# Patient Record
Sex: Male | Born: 1970 | Race: White | Marital: Single
Health system: Northeastern US, Community
[De-identification: ages and names within clinical notes are randomized; demographics above are authoritative.]

## PROBLEM LIST (undated history)

## (undated) DIAGNOSIS — G44009 Cluster headache syndrome, unspecified, not intractable: Secondary | ICD-10-CM

## (undated) DIAGNOSIS — Z9889 Other specified postprocedural states: Secondary | ICD-10-CM

## (undated) DIAGNOSIS — M1990 Unspecified osteoarthritis, unspecified site: Secondary | ICD-10-CM

## (undated) DIAGNOSIS — R112 Nausea with vomiting, unspecified: Secondary | ICD-10-CM

## (undated) DIAGNOSIS — M5030 Other cervical disc degeneration, unspecified cervical region: Secondary | ICD-10-CM

## (undated) DIAGNOSIS — T8859XA Other complications of anesthesia, initial encounter: Secondary | ICD-10-CM

## (undated) DIAGNOSIS — S42309A Unspecified fracture of shaft of humerus, unspecified arm, initial encounter for closed fracture: Secondary | ICD-10-CM

## (undated) DIAGNOSIS — T4145XA Adverse effect of unspecified anesthetic, initial encounter: Secondary | ICD-10-CM

## (undated) HISTORY — PX: KNEE ARTHROSCOPY: SUR90

## (undated) HISTORY — PX: TONSILLECTOMY: SUR1361

---

## 1998-12-22 ENCOUNTER — Ambulatory Visit: Admission: RE | Admit: 1998-12-22 | Discharge: 1998-12-22 | Payer: Self-pay | Admitting: Family Medicine

## 1999-04-12 ENCOUNTER — Encounter: Payer: Self-pay | Admitting: Emergency Medicine

## 1999-04-12 ENCOUNTER — Emergency Department (HOSPITAL_COMMUNITY): Admission: EM | Admit: 1999-04-12 | Discharge: 1999-04-12 | Payer: Self-pay | Admitting: Emergency Medicine

## 2002-06-18 ENCOUNTER — Ambulatory Visit (HOSPITAL_COMMUNITY): Admission: RE | Admit: 2002-06-18 | Discharge: 2002-06-18 | Payer: Self-pay

## 2014-09-23 ENCOUNTER — Encounter (HOSPITAL_COMMUNITY): Payer: Self-pay | Admitting: Emergency Medicine

## 2014-09-23 ENCOUNTER — Emergency Department (HOSPITAL_COMMUNITY)
Admission: EM | Admit: 2014-09-23 | Discharge: 2014-09-23 | Disposition: A | Discharge status: Home / Self Care | Payer: BC Managed Care – PPO | Attending: Emergency Medicine | Admitting: Emergency Medicine

## 2014-09-23 DIAGNOSIS — Y939 Activity, unspecified: Secondary | ICD-10-CM | POA: Diagnosis not present

## 2014-09-23 DIAGNOSIS — Y929 Unspecified place or not applicable: Secondary | ICD-10-CM | POA: Insufficient documentation

## 2014-09-23 DIAGNOSIS — X58XXXA Exposure to other specified factors, initial encounter: Secondary | ICD-10-CM | POA: Insufficient documentation

## 2014-09-23 DIAGNOSIS — Z791 Long term (current) use of non-steroidal anti-inflammatories (NSAID): Secondary | ICD-10-CM | POA: Insufficient documentation

## 2014-09-23 DIAGNOSIS — S4991XA Unspecified injury of right shoulder and upper arm, initial encounter: Secondary | ICD-10-CM | POA: Insufficient documentation

## 2014-09-23 DIAGNOSIS — S46001A Unspecified injury of muscle(s) and tendon(s) of the rotator cuff of right shoulder, initial encounter: Secondary | ICD-10-CM

## 2014-09-23 DIAGNOSIS — M1990 Unspecified osteoarthritis, unspecified site: Secondary | ICD-10-CM | POA: Insufficient documentation

## 2014-09-23 DIAGNOSIS — Z8669 Personal history of other diseases of the nervous system and sense organs: Secondary | ICD-10-CM | POA: Insufficient documentation

## 2014-09-23 DIAGNOSIS — Z79899 Other long term (current) drug therapy: Secondary | ICD-10-CM | POA: Insufficient documentation

## 2014-09-23 HISTORY — DX: Cluster headache syndrome, unspecified, not intractable: G44.009

## 2014-09-23 HISTORY — DX: Unspecified osteoarthritis, unspecified site: M19.90

## 2014-09-23 MED ORDER — METHOCARBAMOL 500 MG PO TABS: 500.0000 mg | ORAL_TABLET | Freq: Four times a day (QID) | ORAL | Status: DC | PRN

## 2014-09-23 MED ORDER — OXYCODONE-ACETAMINOPHEN 5-325 MG PO TABS
1.0000 | ORAL_TABLET | ORAL | Status: DC | PRN
Start: 2014-09-23 — End: 2014-11-28

## 2014-09-23 MED ORDER — OXYCODONE-ACETAMINOPHEN 5-325 MG PO TABS: 1.0000 | ORAL_TABLET | ORAL | Status: DC | PRN

## 2014-09-23 MED ORDER — NAPROXEN 500 MG PO TABS: 500.0000 mg | ORAL_TABLET | Freq: Two times a day (BID) | ORAL | Status: DC

## 2014-09-23 MED ORDER — OXYCODONE-ACETAMINOPHEN 5-325 MG PO TABS
1.0000 | ORAL_TABLET | Freq: Once | ORAL | Status: AC
  Administered 2014-09-23: 1 via ORAL
  Filled 2014-09-23: qty 1

## 2014-09-23 NOTE — ED Provider Notes (Signed)
CSN: [id number]     Arrival date & time 09/23/14  0304 History   First MD Initiated Contact with Patient 09/23/14 0335     Chief Complaint  Patient presents with  . Extremity Pain     (Consider location/radiation/quality/duration/timing/severity/associated sxs/prior Treatment) Patient is a 43 y.o. male presenting with extremity pain. The history is provided by the patient.  Extremity Pain  He woke up 3 days ago with pain in the posterior aspect of the right shoulder. Pain did radiate into the shoulder and into the upper arm. He describes a burning pain. Pain is as severe as 7/10. It seems to be worse when he sits down and better if he stands up. Her he denies any trauma but states he did have to help lift up a washer and dryer about one week earlier. There is no numbness or tingling. He went to another emergency department where he was diagnosed with muscle spasm and was given a prescription for diazepam. There has been some dyspnea but has not helped the pain. He has been taking naproxen 660 mg 3 times a day without relief.  Past Medical History  Diagnosis Date  . Arthritis   . Headache, cluster    Past Surgical History  Procedure Laterality Date  . Knee arthroscopy    . Tonsillectomy     No family history on file. History  Substance Use Topics  . Smoking status: Never Smoker   . Smokeless tobacco: Not on file  . Alcohol Use: Yes    Review of Systems  All other systems reviewed and are negative.     Allergies  Review of patient's allergies indicates no known allergies.  Home Medications   Prior to Admission medications   Medication Sig Start Date End Date Taking? Authorizing Provider  diazepam (VALIUM) 5 MG tablet Take by mouth 2 (two) times daily as needed for anxiety.   Yes Historical Provider, MD  methocarbamol (ROBAXIN) 500 MG tablet Take 1 tablet (500 mg total) by mouth 4 (four) times daily as needed for muscle spasms. 09/23/14   Dione Boozeavid Eladio Dentremont, MD  naproxen  (NAPROSYN) 500 MG tablet Take 1 tablet (500 mg total) by mouth 2 (two) times daily. 09/23/14   Dione Boozeavid Carly Sabo, MD  oxyCODONE-acetaminophen (PERCOCET) 5-325 MG per tablet Take 1 tablet by mouth every 4 (four) hours as needed for moderate pain. 09/23/14   Dione Boozeavid Rema Lievanos, MD  oxyCODONE-acetaminophen (PERCOCET/ROXICET) 5-325 MG per tablet Take 1 tablet by mouth every 4 (four) hours as needed. 09/23/14   Dione Boozeavid Akansha Wyche, MD   BP 134/92  Pulse 94  Temp(Src) 97.6 F (36.4 C) (Oral)  Resp 18  Ht 5\' 7"  (1.702 m)  Wt 250 lb (113.399 kg)  BMI 39.15 kg/m2  SpO2 99% Physical Exam  Nursing note and vitals reviewed.  43 year old male, resting comfortably and in no acute distress. Vital signs are significant for borderline hypertension. Oxygen saturation is 99%, which is normal. Head is normocephalic and atraumatic. PERRLA, EOMI. Oropharynx is clear. Neck is nontender and supple without adenopathy or JVD. Back has mild tenderness superior and lateral to the right scapula. There is no CVA tenderness. Lungs are clear without rales, wheezes, or rhonchi. Chest is nontender. Heart has regular rate and rhythm without murmur. Abdomen is soft, flat, nontender without masses or hepatosplenomegaly and peristalsis is normoactive. Extremities: There is marked tenderness to palpation of the right shoulder and the anterior deltoid groove. Rotator cuff impingement signs are are present. Passive range of motion is  present. Distal neurovascular exam is an intact grip strong pulses, a prompt capillary refill, normal sensation. Objective weight, he has normal strength in all muscles of the right arm. Skin is warm and dry without rash. Neurologic: Mental status is normal, cranial nerves are intact, there are no motor or sensory deficits.   ED Course  Procedures (including critical care time)  MDM   Final diagnoses:  Injury of right rotator cuff, initial encounter    Right rotator cuff injury. He is referred to orthopedics  for followup. He is given prescriptions for naproxen, oxycodone acetaminophen, and methocarbamol. He is advised to limit his dose of naproxen 2 the prescribed amount-500 mg twice a day.    Dione Boozeavid Thia Olesen, MD 09/23/14 0400

## 2014-09-23 NOTE — ED Notes (Signed)
Pt reports he woke up Saturday & was having right shoulder pain. C/o of a burning feeling. [facility] & given valium w/ no relief. Pt denies any new activity or injury.

## 2014-09-23 NOTE — Discharge Instructions (Signed)
Rotator Cuff Tendinitis  °Rotator cuff tendinitis is inflammation of the tough, cord-like bands that connect muscle to bone (tendons) in your rotator cuff. Your rotator cuff is the collection of all the muscles and tendons that connect your arm to your shoulder. Your rotator cuff holds the head of your upper arm bone (humerus) in the cup (fossa) of your shoulder blade (scapula). °CAUSES °Rotator cuff tendinitis is usually caused by overusing the joint involved.  °SIGNS AND SYMPTOMS °· Deep ache in the shoulder also felt on the outside upper arm over the shoulder muscle. °· Point tenderness over the area that is injured. °· Pain comes on gradually and becomes worse with lifting the arm to the side (abduction) or turning it inward (internal rotation). °· May lead to a chronic tear: When a rotator cuff tendon becomes inflamed, it runs the risk of losing its blood supply, causing some tendon fibers to die. This increases the risk that the tendon can fray and partially or completely tear. °DIAGNOSIS °Rotator cuff tendinitis is diagnosed by taking a medical history, performing a physical exam, and reviewing results of imaging exams. The medical history is useful to help determine the type of rotator cuff injury. The physical exam will include looking at the injured shoulder, feeling the injured area, and watching you do range-of-motion exercises. X-ray exams are typically done to rule out other causes of shoulder pain, such as fractures. MRI is the imaging exam usually used for significant shoulder injuries. Sometimes a dye study called CT arthrogram is done, but it is not as widely used as MRI. In some institutions, special ultrasound tests may also be used to aid in the diagnosis. °TREATMENT  °Less Severe Cases °· Use of a sling to rest the shoulder for a short period of time. Prolonged use of the sling can cause stiffness, weakness, and loss of motion of the shoulder joint. °· Anti-inflammatory medicines, such as  ibuprofen or naproxen sodium, may be prescribed. °More Severe Cases °· Physical therapy. °· Use of steroid injections into the shoulder joint. °· Surgery. °HOME CARE INSTRUCTIONS  °· Use a sling or splint until the pain decreases. Prolonged use of the sling can cause stiffness, weakness, and loss of motion of the shoulder joint. °· Apply ice to the injured area: °¨ Put ice in a plastic bag. °¨ Place a towel between your skin and the bag. °¨ Leave the ice on for 20 minutes, 2-3 times a day. °· Try to avoid use other than gentle range of motion while your shoulder is painful. Use the shoulder and exercise only as directed by your health care provider. Stop exercises or range of motion if pain or discomfort increases, unless directed otherwise by your health care provider. °· Only take over-the-counter or prescription medicines for pain, discomfort, or fever as directed by your health care provider. °· If you were given a shoulder sling and straps (immobilizer), do not remove it except as directed, or until you see a health care provider for a follow-up exam. If you need to remove it, move your arm as little as possible or as directed. °· You may want to sleep on several pillows at night to lessen swelling and pain. °SEEK IMMEDIATE MEDICAL CARE IF:  °· Your shoulder pain increases or new pain develops in your arm, hand, or fingers and is not relieved with medicines. °· You have new, unexplained symptoms, especially increased numbness in the hands or loss of strength. °· You develop any worsening of the problems   that brought you in for care.  Your arm, hand, or fingers are numb or tingling.  Your arm, hand, or fingers are swollen, painful, or turn white or blue. MAKE SURE YOU:  Understand these instructions.  Will watch your condition.  Will get help right away if you are not doing well or get worse. Document Released: 02/11/2004 Document Revised: 09/11/2013 Document Reviewed: 07/03/2013 [facility] Patient  Information 2015 [employer] [place]. This information is not intended to replace advice given to you by your health care provider. Make sure you discuss any questions you have with your health care provider.  Naproxen and naproxen sodium oral immediate-release tablets What is this medicine? NAPROXEN (na PROX en) is a non-steroidal anti-inflammatory drug (NSAID). It is used to reduce swelling and to treat pain. This medicine may be used for dental pain, headache, or painful monthly periods. It is also used for painful joint and muscular problems such as arthritis, tendinitis, bursitis, and gout. This medicine may be used for other purposes; ask your health care provider or pharmacist if you have questions. COMMON BRAND NAME(S): Aflaxen, Aleve, Aleve Arthritis, All Day Relief, Anaprox, Anaprox DS, Naprosyn What should I tell my health care provider before I take this medicine? They need to know if you have any of these conditions: -asthma -cigarette smoker -drink more than 3 alcohol containing drinks a day -heart disease or circulation problems such as heart failure or leg edema (fluid retention) -high blood pressure -kidney disease -liver disease -stomach bleeding or ulcers -an unusual or allergic reaction to naproxen, aspirin, other NSAIDs, other medicines, foods, dyes, or preservatives -pregnant or trying to get pregnant -breast-feeding How should I use this medicine? Take this medicine by mouth with a glass of water. Follow the directions on the prescription label. Take it with food if your stomach gets upset. Try to not lie down for at least 10 minutes after you take it. Take your medicine at regular intervals. Do not take your medicine more often than directed. Long-term, continuous use may increase the risk of heart attack or stroke. A special MedGuide will be given to you by the pharmacist with each prescription and refill. Be sure to read this information carefully each time. Talk to your  pediatrician regarding the use of this medicine in children. Special care may be needed. Overdosage: If you think you have taken too much of this medicine contact a poison control center or emergency room at once. NOTE: This medicine is only for you. Do not share this medicine with others. What if I miss a dose? If you miss a dose, take it as soon as you can. If it is almost time for your next dose, take only that dose. Do not take double or extra doses. What may interact with this medicine? -alcohol -aspirin -cidofovir -diuretics -lithium -methotrexate -other drugs for inflammation like ketorolac or prednisone -pemetrexed -probenecid -warfarin This list may not describe all possible interactions. Give your health care provider a list of all the medicines, herbs, non-prescription drugs, or dietary supplements you use. Also tell them if you smoke, drink alcohol, or use illegal drugs. Some items may interact with your medicine. What should I watch for while using this medicine? Tell your doctor or health care professional if your pain does not get better. Talk to your doctor before taking another medicine for pain. Do not treat yourself. This medicine does not prevent heart attack or stroke. In fact, this medicine may increase the chance of a heart attack or stroke.  The chance may increase with longer use of this medicine and in people who have heart disease. If you take aspirin to prevent heart attack or stroke, talk with your doctor or health care professional. Do not take other medicines that contain aspirin, ibuprofen, or naproxen with this medicine. Side effects such as stomach upset, nausea, or ulcers may be more likely to occur. Many medicines available without a prescription should not be taken with this medicine. This medicine can cause ulcers and bleeding in the stomach and intestines at any time during treatment. Do not smoke cigarettes or drink alcohol. These increase irritation to  your stomach and can make it more susceptible to damage from this medicine. Ulcers and bleeding can happen without warning symptoms and can cause death. You may get drowsy or dizzy. Do not drive, use machinery, or do anything that needs mental alertness until you know how this medicine affects you. Do not stand or sit up quickly, especially if you are an older patient. This reduces the risk of dizzy or fainting spells. This medicine can cause you to bleed more easily. Try to avoid damage to your teeth and gums when you brush or floss your teeth. What side effects may I notice from receiving this medicine? Side effects that you should report to your doctor or health care professional as soon as possible: -black or bloody stools, blood in the urine or vomit -blurred vision -chest pain -difficulty breathing or wheezing -nausea or vomiting -severe stomach pain -skin rash, skin redness, blistering or peeling skin, hives, or itching -slurred speech or weakness on one side of the body -swelling of eyelids, throat, lips -unexplained weight gain or swelling -unusually weak or tired -yellowing of eyes or skin Side effects that usually do not require medical attention (report to your doctor or health care professional if they continue or are bothersome): -constipation -headache -heartburn This list may not describe all possible side effects. Call your doctor for medical advice about side effects. You may report side effects to FDA at 1-800-FDA-1088. Where should I keep my medicine? Keep out of the reach of children. Store at room temperature between 15 and 30 degrees C (59 and 86 degrees F). Keep container tightly closed. Throw away any unused medicine after the expiration date. NOTE: This sheet is a summary. It may not cover all possible information. If you have questions about this medicine, talk to your doctor, pharmacist, or health care provider.  2015, [employer] Standard. (2009-11-23  20:10:16)  Acetaminophen; Oxycodone tablets What is this medicine? ACETAMINOPHEN; OXYCODONE (a set a MEE noe fen; ox i KOE done) is a pain reliever. It is used to treat mild to moderate pain. This medicine may be used for other purposes; ask your health care provider or pharmacist if you have questions. COMMON BRAND NAME(S): Endocet, Magnacet, Narvox, Percocet, Perloxx, Primalev, Primlev, Roxicet, Xolox What should I tell my health care provider before I take this medicine? They need to know if you have any of these conditions: -brain tumor -Crohn's disease, inflammatory bowel disease, or ulcerative colitis -drug abuse or addiction -head injury -heart or circulation problems -if you often drink alcohol -kidney disease or problems going to the bathroom -liver disease -lung disease, asthma, or breathing problems -an unusual or allergic reaction to acetaminophen, oxycodone, other opioid analgesics, other medicines, foods, dyes, or preservatives -pregnant or trying to get pregnant -breast-feeding How should I use this medicine? Take this medicine by mouth with a full glass of water. Follow the directions on  the prescription label. Take your medicine at regular intervals. Do not take your medicine more often than directed. Talk to your pediatrician regarding the use of this medicine in children. Special care may be needed. Patients over 70 years old may have a stronger reaction and need a smaller dose. Overdosage: If you think you have taken too much of this medicine contact a poison control center or emergency room at once. NOTE: This medicine is only for you. Do not share this medicine with others. What if I miss a dose? If you miss a dose, take it as soon as you can. If it is almost time for your next dose, take only that dose. Do not take double or extra doses. What may interact with this medicine? -alcohol -antihistamines -barbiturates like amobarbital, butalbital, butabarbital,  methohexital, pentobarbital, phenobarbital, thiopental, and secobarbital -benztropine -drugs for bladder problems like solifenacin, trospium, oxybutynin, tolterodine, hyoscyamine, and methscopolamine -drugs for breathing problems like ipratropium and tiotropium -drugs for certain stomach or intestine problems like propantheline, homatropine methylbromide, glycopyrrolate, atropine, belladonna, and dicyclomine -general anesthetics like etomidate, ketamine, nitrous oxide, propofol, desflurane, enflurane, halothane, isoflurane, and sevoflurane -medicines for depression, anxiety, or psychotic disturbances -medicines for sleep -muscle relaxants -naltrexone -narcotic medicines (opiates) for pain -phenothiazines like perphenazine, thioridazine, chlorpromazine, mesoridazine, fluphenazine, prochlorperazine, promazine, and trifluoperazine -scopolamine -tramadol -trihexyphenidyl This list may not describe all possible interactions. Give your health care provider a list of all the medicines, herbs, non-prescription drugs, or dietary supplements you use. Also tell them if you smoke, drink alcohol, or use illegal drugs. Some items may interact with your medicine. What should I watch for while using this medicine? Tell your doctor or health care professional if your pain does not go away, if it gets worse, or if you have new or a different type of pain. You may develop tolerance to the medicine. Tolerance means that you will need a higher dose of the medication for pain relief. Tolerance is normal and is expected if you take this medicine for a long time. Do not suddenly stop taking your medicine because you may develop a severe reaction. Your body becomes used to the medicine. This does NOT mean you are addicted. Addiction is a behavior related to getting and using a drug for a non-medical reason. If you have pain, you have a medical reason to take pain medicine. Your doctor will tell you how much medicine to  take. If your doctor wants you to stop the medicine, the dose will be slowly lowered over time to avoid any side effects. You may get drowsy or dizzy. Do not drive, use machinery, or do anything that needs mental alertness until you know how this medicine affects you. Do not stand or sit up quickly, especially if you are an older patient. This reduces the risk of dizzy or fainting spells. Alcohol may interfere with the effect of this medicine. Avoid alcoholic drinks. There are different types of narcotic medicines (opiates) for pain. If you take more than one type at the same time, you may have more side effects. Give your health care provider a list of all medicines you use. Your doctor will tell you how much medicine to take. Do not take more medicine than directed. Call emergency for help if you have problems breathing. The medicine will cause constipation. Try to have a bowel movement at least every 2 to 3 days. If you do not have a bowel movement for 3 days, call your doctor or health care professional. Do  not take Tylenol (acetaminophen) or medicines that have acetaminophen with this medicine. Too much acetaminophen can be very dangerous. Many nonprescription medicines contain acetaminophen. Always read the labels carefully to avoid taking more acetaminophen. What side effects may I notice from receiving this medicine? Side effects that you should report to your doctor or health care professional as soon as possible: -allergic reactions like skin rash, itching or hives, swelling of the face, lips, or tongue -breathing difficulties, wheezing -confusion -light headedness or fainting spells -severe stomach pain -unusually weak or tired -yellowing of the skin or the whites of the eyes Side effects that usually do not require medical attention (report to your doctor or health care professional if they continue or are bothersome): -dizziness -drowsiness -nausea -vomiting This list may not describe  all possible side effects. Call your doctor for medical advice about side effects. You may report side effects to FDA at 1-800-FDA-1088. Where should I keep my medicine? Keep out of the reach of children. This medicine can be abused. Keep your medicine in a safe place to protect it from theft. Do not share this medicine with anyone. Selling or giving away this medicine is dangerous and against the law. Store at room temperature between 20 and 25 degrees C (68 and 77 degrees F). Keep container tightly closed. Protect from light. This medicine may cause accidental overdose and death if it is taken by other adults, children, or pets. Flush any unused medicine down the toilet to reduce the chance of harm. Do not use the medicine after the expiration date. NOTE: This sheet is a summary. It may not cover all possible information. If you have questions about this medicine, talk to your doctor, pharmacist, or health care provider.  2015, [employer] Standard. (2013-07-15 13:17:35)  Methocarbamol tablets What is this medicine? METHOCARBAMOL (meth oh KAR ba mole) helps to relieve pain and stiffness in muscles caused by strains, sprains, or other injury to your muscles. This medicine may be used for other purposes; ask your health care provider or pharmacist if you have questions. COMMON BRAND NAME(S): Robaxin What should I tell my health care provider before I take this medicine? They need to know if you have any of these conditions: -kidney disease -seizures -an unusual or allergic reaction to methocarbamol, other medicines, foods, dyes, or preservatives -pregnant or trying to get pregnant -breast-feeding How should I use this medicine? Take this medicine by mouth with a full glass of water. Follow the directions on the prescription label. Take your medicine at regular intervals. Do not take your medicine more often than directed. Talk to your pediatrician regarding the use of this medicine in  children. Special care may be needed. Overdosage: If you think you have taken too much of this medicine contact a poison control center or emergency room at once. NOTE: This medicine is only for you. Do not share this medicine with others. What if I miss a dose? If you miss a dose, take it as soon as you can. If it is almost time for your next dose, take only the next dose. Do not take double or extra doses. What may interact with this medicine? -alcohol or medicines that contain alcohol -cholinesterase inhibitors like neostigmine, ambenonium, and pyridostigmine bromide -other medicines that cause drowsiness This list may not describe all possible interactions. Give your health care provider a list of all the medicines, herbs, non-prescription drugs, or dietary supplements you use. Also tell them if you smoke, drink alcohol, or use illegal drugs.  Some items may interact with your medicine. What should I watch for while using this medicine? You may get drowsy or dizzy. Do not drive, use machinery, or do anything that needs mental alertness until you know how this medicine affects you. Do not stand or sit up quickly, especially if you are an older patient. This reduces the risk of dizzy or fainting spells. Alcohol may interfere with the effect of this medicine. Avoid alcoholic drinks. What side effects may I notice from receiving this medicine? Side effects that you should report to your doctor or health care professional as soon as possible: -allergic reactions like skin rash, itching or hives, swelling of the face, lips, or tongue -blurred vision or changes in vision -confusion -fainting spells -fever -nausea or vomiting -seizures Side effects that usually do not require medical attention (report to your doctor or health care professional if they continue or are bothersome): -dizziness -drowsiness -headache -metallic taste This list may not describe all possible side effects. Call your  doctor for medical advice about side effects. You may report side effects to FDA at 1-800-FDA-1088. Where should I keep my medicine? Keep out of the reach of children. Store at room temperature between 20 and 25 degrees C (68 and 77 degrees F). Keep container tightly closed. Throw away any unused medicine after the expiration date. NOTE: This sheet is a summary. It may not cover all possible information. If you have questions about this medicine, talk to your doctor, pharmacist, or health care provider.  2015, [employer] Standard. (2008-06-09 16:02:03)

## 2014-09-23 NOTE — ED Notes (Signed)
Pt alert & oriented x4, stable gait. Patient given discharge instructions, paperwork & prescription(s). Patient  instructed to stop at the registration desk to finish any additional paperwork. Patient verbalized understanding. Pt left department w/ no further questions. 

## 2014-09-29 MED FILL — Oxycodone w/ Acetaminophen Tab 5-325 MG: ORAL | Qty: 6 | Status: AC

## 2014-11-04 DIAGNOSIS — S42309A Unspecified fracture of shaft of humerus, unspecified arm, initial encounter for closed fracture: Secondary | ICD-10-CM

## 2014-11-04 HISTORY — DX: Unspecified fracture of shaft of humerus, unspecified arm, initial encounter for closed fracture: S42.309A

## 2014-11-28 ENCOUNTER — Emergency Department (HOSPITAL_COMMUNITY): Payer: BC Managed Care – PPO

## 2014-11-28 ENCOUNTER — Emergency Department (HOSPITAL_COMMUNITY)
Admission: EM | Admit: 2014-11-28 | Discharge: 2014-11-28 | Disposition: A | Discharge status: Home / Self Care | Payer: BC Managed Care – PPO | Attending: Emergency Medicine | Admitting: Emergency Medicine

## 2014-11-28 ENCOUNTER — Encounter (HOSPITAL_COMMUNITY): Payer: Self-pay | Admitting: *Deleted

## 2014-11-28 DIAGNOSIS — Z79899 Other long term (current) drug therapy: Secondary | ICD-10-CM | POA: Diagnosis not present

## 2014-11-28 DIAGNOSIS — Z791 Long term (current) use of non-steroidal anti-inflammatories (NSAID): Secondary | ICD-10-CM | POA: Insufficient documentation

## 2014-11-28 DIAGNOSIS — Z8669 Personal history of other diseases of the nervous system and sense organs: Secondary | ICD-10-CM | POA: Diagnosis not present

## 2014-11-28 DIAGNOSIS — S42201A Unspecified fracture of upper end of right humerus, initial encounter for closed fracture: Secondary | ICD-10-CM | POA: Insufficient documentation

## 2014-11-28 DIAGNOSIS — W1830XA Fall on same level, unspecified, initial encounter: Secondary | ICD-10-CM | POA: Insufficient documentation

## 2014-11-28 DIAGNOSIS — Y9289 Other specified places as the place of occurrence of the external cause: Secondary | ICD-10-CM | POA: Diagnosis not present

## 2014-11-28 DIAGNOSIS — S59901A Unspecified injury of right elbow, initial encounter: Secondary | ICD-10-CM | POA: Insufficient documentation

## 2014-11-28 DIAGNOSIS — Y998 Other external cause status: Secondary | ICD-10-CM | POA: Insufficient documentation

## 2014-11-28 DIAGNOSIS — M1990 Unspecified osteoarthritis, unspecified site: Secondary | ICD-10-CM | POA: Insufficient documentation

## 2014-11-28 DIAGNOSIS — S4991XA Unspecified injury of right shoulder and upper arm, initial encounter: Secondary | ICD-10-CM | POA: Diagnosis present

## 2014-11-28 DIAGNOSIS — Y9389 Activity, other specified: Secondary | ICD-10-CM | POA: Insufficient documentation

## 2014-11-28 DIAGNOSIS — M25521 Pain in right elbow: Secondary | ICD-10-CM

## 2014-11-28 DIAGNOSIS — S42301A Unspecified fracture of shaft of humerus, right arm, initial encounter for closed fracture: Secondary | ICD-10-CM

## 2014-11-28 DIAGNOSIS — W19XXXA Unspecified fall, initial encounter: Secondary | ICD-10-CM

## 2014-11-28 DIAGNOSIS — M25511 Pain in right shoulder: Secondary | ICD-10-CM

## 2014-11-28 MED ORDER — OXYCODONE-ACETAMINOPHEN 5-325 MG PO TABS: 2.0000 | ORAL_TABLET | ORAL | Status: DC | PRN

## 2014-11-28 MED ORDER — OXYCODONE-ACETAMINOPHEN 5-325 MG PO TABS
ORAL_TABLET | ORAL | Status: AC
  Filled 2014-11-28: qty 2

## 2014-11-28 MED ORDER — OXYCODONE-ACETAMINOPHEN 5-325 MG PO TABS
2.0000 | ORAL_TABLET | Freq: Once | ORAL | Status: AC
  Administered 2014-11-28: 2 via ORAL

## 2014-11-28 NOTE — ED Notes (Signed)
Pt states he stumbled & tried to catch himself. Injuring right arm

## 2014-11-28 NOTE — ED Notes (Signed)
Pt has good sensation, pulses to the right upper ext. Pt with limited movement. No swelling or deformity noted.

## 2014-11-28 NOTE — Discharge Instructions (Signed)
You have fractured your humerus bone. Ice, sling, pain medication, follow-up with the orthopedic surgeon.

## 2014-11-28 NOTE — ED Provider Notes (Signed)
CSN: [id number]     Arrival date & time 11/28/14  0155 History   First MD Initiated Contact with Patient 11/28/14 0205     Chief Complaint  Patient presents with  . Shoulder Injury     (Consider location/radiation/quality/duration/timing/severity/associated sxs/prior Treatment) HPI.... Accidental mechanical fall a brief time ago with injury to his right upper extremity. He is presently awaiting cervical surgery. Pain is now in the right shoulder and right elbow. No head or neck trauma. No other injuries. Severity is moderate. Palpation and positioning make pain worse.  Past Medical History  Diagnosis Date  . Arthritis   . Headache, cluster    Past Surgical History  Procedure Laterality Date  . Knee arthroscopy    . Tonsillectomy     No family history on file. History  Substance Use Topics  . Smoking status: Never Smoker   . Smokeless tobacco: Not on file  . Alcohol Use: Yes    Review of Systems  All other systems reviewed and are negative.     Allergies  Review of patient's allergies indicates no known allergies.  Home Medications   Prior to Admission medications   Medication Sig Start Date End Date Taking? Authorizing Provider  acetaminophen (TYLENOL) 500 MG tablet Take 500 mg by mouth every 4 (four) hours as needed.   Yes Historical Provider, MD  gabapentin (NEURONTIN) 300 MG capsule Take 300 mg by mouth 3 (three) times daily.   Yes Historical Provider, MD  diazepam (VALIUM) 5 MG tablet Take by mouth 2 (two) times daily as needed for anxiety.    Historical Provider, MD  methocarbamol (ROBAXIN) 500 MG tablet Take 1 tablet (500 mg total) by mouth 4 (four) times daily as needed for muscle spasms. 09/23/14   Dione Boozeavid Glick, MD  naproxen (NAPROSYN) 500 MG tablet Take 1 tablet (500 mg total) by mouth 2 (two) times daily. 09/23/14   Dione Boozeavid Glick, MD  oxyCODONE-acetaminophen (PERCOCET) 5-325 MG per tablet Take 2 tablets by mouth every 4 (four) hours as needed. 11/28/14   Donnetta HutchingBrian  Montey Ebel, MD  oxyCODONE-acetaminophen (PERCOCET) 5-325 MG per tablet Take 2 tablets by mouth every 4 (four) hours as needed. 11/28/14   Donnetta HutchingBrian Crystol Walpole, MD   BP 141/92 mmHg  Pulse 112  Temp(Src) 98.2 F (36.8 C) (Oral)  Resp 18  Ht 5\' 7"  (1.702 m)  Wt 265 lb (120.203 kg)  BMI 41.50 kg/m2  SpO2 99% Physical Exam  Constitutional: He is oriented to person, place, and time. He appears well-developed and well-nourished.  HENT:  Head: Normocephalic and atraumatic.  Eyes: Conjunctivae and EOM are normal. Pupils are equal, round, and reactive to light.  Neck: Normal range of motion. Neck supple.  Cardiovascular: Normal rate and regular rhythm.   Pulmonary/Chest: Effort normal and breath sounds normal.  Abdominal: Soft. Bowel sounds are normal.  Musculoskeletal:  Right upper extremity: Patient has arm in a flexed position at the elbow. Most tender at the proximal humerus.  Neurological: He is alert and oriented to person, place, and time.  Skin: Skin is warm and dry.  Psychiatric: He has a normal mood and affect. His behavior is normal.  Nursing note and vitals reviewed.   ED Course  Procedures (including critical care time) Labs Review Labs Reviewed - No data to display  Imaging Review Dg Shoulder Right  11/28/2014   CLINICAL DATA:  Status post fall onto concrete, on right shoulder. Right shoulder pain. Initial encounter.  EXAM: RIGHT SHOULDER - 2+ VIEW  COMPARISON:  None.  FINDINGS: There is a comminuted minimally displaced fracture involving the right humeral head and neck, with greater tuberosity fragments seen. The humeral head remains seated at the glenoid fossa. An associated joint effusion is seen. Minimal degenerative change is noted at the right acromioclavicular joint. The right lung appears clear.  IMPRESSION: Comminuted minimally displaced fracture involving the right humeral head and neck, with greater tuberosity fragments seen. Associated joint effusion noted. No evidence of  dislocation.   Electronically Signed   By: Roanna RaiderJeffery  Chang M.D.   On: 11/28/2014 03:31   Dg Elbow 2 Views Right  11/28/2014   CLINICAL DATA:  Status post fall onto concrete; right elbow pain. Initial encounter.  EXAM: RIGHT ELBOW - 2 VIEW  COMPARISON:  None.  FINDINGS: There is no evidence of fracture or dislocation. The visualized joint spaces are preserved. No significant joint effusion is identified. The soft tissues are unremarkable in appearance.  IMPRESSION: No evidence of fracture or dislocation.   Electronically Signed   By: Roanna RaiderJeffery  Chang M.D.   On: 11/28/2014 03:46     EKG Interpretation None      MDM   Final diagnoses:  Fall  Right shoulder pain  Right elbow pain  Fracture, humerus, right, closed, initial encounter    Plain films of right shoulder and right elbow reveal a comminuted minimally displaced fracture involving the right humeral head and neck.  These findings were discussed with the patient, wife and father. [facility]. Sling, ice, Percocet for pain.    Donnetta HutchingBrian Gissel Keilman, MD 11/28/14 [phone]

## 2014-11-28 NOTE — ED Notes (Signed)
Pt alert & oriented x4, stable gait. Patient given discharge instructions, paperwork & prescription(s). Patient  instructed to stop at the registration desk to finish any additional paperwork. Patient verbalized understanding. Pt left department w/ no further questions. 

## 2014-12-01 MED FILL — Oxycodone w/ Acetaminophen Tab 5-325 MG: ORAL | Qty: 6 | Status: AC

## 2014-12-19 ENCOUNTER — Encounter (HOSPITAL_COMMUNITY): Payer: Self-pay | Admitting: Pharmacy Technician

## 2014-12-23 ENCOUNTER — Inpatient Hospital Stay (HOSPITAL_COMMUNITY): Admission: RE | Admit: 2014-12-23 | Payer: BC Managed Care – PPO | Source: Ambulatory Visit

## 2014-12-25 ENCOUNTER — Encounter (HOSPITAL_COMMUNITY): Admission: RE | Payer: Self-pay | Source: Ambulatory Visit

## 2014-12-25 ENCOUNTER — Ambulatory Visit (HOSPITAL_COMMUNITY)
Admission: RE | Admit: 2014-12-25 | Payer: BC Managed Care – PPO | Source: Ambulatory Visit | Admitting: Orthopedic Surgery

## 2014-12-25 SURGERY — ANTERIOR CERVICAL DECOMPRESSION/DISCECTOMY FUSION 2 LEVELS
Anesthesia: General

## 2015-04-09 ENCOUNTER — Encounter (HOSPITAL_COMMUNITY)
Admission: RE | Admit: 2015-04-09 | Discharge: 2015-04-09 | Disposition: A | Discharge status: Home / Self Care | Payer: BC Managed Care – PPO | Source: Ambulatory Visit | Attending: Orthopedic Surgery | Admitting: Orthopedic Surgery

## 2015-04-09 ENCOUNTER — Encounter (HOSPITAL_COMMUNITY): Payer: Self-pay

## 2015-04-09 DIAGNOSIS — Z01818 Encounter for other preprocedural examination: Secondary | ICD-10-CM | POA: Insufficient documentation

## 2015-04-09 DIAGNOSIS — M4722 Other spondylosis with radiculopathy, cervical region: Secondary | ICD-10-CM | POA: Diagnosis not present

## 2015-04-09 DIAGNOSIS — Z01812 Encounter for preprocedural laboratory examination: Secondary | ICD-10-CM | POA: Diagnosis not present

## 2015-04-09 HISTORY — DX: Other cervical disc degeneration, unspecified cervical region: M50.30

## 2015-04-09 HISTORY — DX: Unspecified fracture of shaft of humerus, unspecified arm, initial encounter for closed fracture: S42.309A

## 2015-04-09 HISTORY — DX: Other complications of anesthesia, initial encounter: T88.59XA

## 2015-04-09 HISTORY — DX: Cluster headache syndrome, unspecified, not intractable: G44.009

## 2015-04-09 HISTORY — DX: Other specified postprocedural states: Z98.890

## 2015-04-09 HISTORY — DX: Adverse effect of unspecified anesthetic, initial encounter: T41.45XA

## 2015-04-09 HISTORY — DX: Morbid (severe) obesity due to excess calories: E66.01

## 2015-04-09 HISTORY — DX: Nausea with vomiting, unspecified: R11.2

## 2015-04-09 LAB — SURGICAL PCR SCREEN
MRSA, PCR: NEGATIVE
STAPHYLOCOCCUS AUREUS: NEGATIVE

## 2015-04-09 NOTE — Pre-Procedure Instructions (Signed)
Tanner CraneRoger E Barber  04/09/2015   Your procedure is scheduled on:  Wednesday, May 11 @ 0830 am  [facility]mitting at 0630 AM.  Call this number if you have problems the morning of surgery: [phone]   Remember:   Do not eat food or drink liquids after midnight.Tuesday night   Take these medicines the morning of surgery with A SIP OF WATER: pain medication if needed   Do not wear jewelry.  Do not wear lotions, powders, or perfumes.Do not wear deodorant.  Do not shave 48 hours prior to surgery. Men may shave face and neck.  Do not bring valuables to the hospital.  [facility] is not responsible for any belongings or valuables.               Contacts, dentures or bridgework may not be worn into surgery.  Leave suitcase in the car. After surgery it may be brought to your room.  For patients admitted to the hospital, discharge time is determined by your  treatment team.               Patients discharged the day of surgery will not be allowed to drive  home.  Name and phone number of your driver:     Special Instructions: [place] - Preparing for Surgery  Before surgery, you can play an important role.  Because skin is not sterile, your skin needs to be as free of germs as possible.  You can reduce the number of germs on you skin by washing with CHG (chlorahexidine gluconate) soap before surgery.  CHG is an antiseptic cleaner which kills germs and bonds with the skin to continue killing germs even after washing.  Please DO NOT use if you have an allergy to CHG or antibacterial soaps.  If your skin becomes reddened/irritated stop using the CHG and inform your nurse when you arrive at Short Stay.  Do not shave (including legs and underarms) for at least 48 hours prior to the first CHG shower.  You may shave your face.  Please follow these instructions carefully:   1.  Shower with CHG Soap the night before surgery and the   morning of Surgery.  2.  If you choose to  wash your hair, wash your hair first as usual with your  normal shampoo.  3.  After you shampoo, rinse your hair and body thoroughly to remove the   Shampoo.  4.  Use CHG as you would any other liquid soap.  You can apply chg directly   to the skin and wash gently with scrungie or a clean washcloth.  5.  Apply the CHG Soap to your body ONLY FROM THE NECK DOWN.    Do not use on open wounds or open sores.  Avoid contact with your eyes,  ears, mouth and genitals (private parts).  Wash genitals (private parts)  with your normal soap.  6.  Wash thoroughly, paying special attention to the area where your surgery  will be performed.  7.  Thoroughly rinse your body with warm water from the neck down.  8.  DO NOT shower/wash with your normal soap after using and rinsing off    the CHG Soap.  9.  Pat yourself dry with a clean towel.            10.  Wear clean pajamas.            11.  Place clean sheets on  your bed the night of your first shower and do not   sleep with pets.  Day of Surgery  Do not apply any lotions/deoderants the morning of surgery.  Please wear clean clothes to the hospital/surgery center.     Please read over the following fact sheets that you were given: Pain Booklet, Coughing and Deep Breathing and Surgical Site Infection Prevention

## 2015-04-09 NOTE — Progress Notes (Signed)
   04/09/15 1538  OBSTRUCTIVE SLEEP APNEA  Have you ever been diagnosed with sleep apnea through a sleep study? No  Do you snore loudly (loud enough to be heard through closed doors)?  0  Do you often feel tired, fatigued, or sleepy during the daytime? 1  Has anyone observed you stop breathing during your sleep? 1  Do you have, or are you being treated for high blood pressure? 0  BMI more than 35 kg/m2? 1  Age over 46 years old? 0  Neck circumference greater than 40 cm/16 inches? 1 (17 1/2)  Gender: 1

## 2015-04-09 NOTE — Progress Notes (Signed)
PCP Dr Italyhad  Crim [facility] [place] Results of STOP-Bang faxed to PCP

## 2015-04-14 MED ORDER — DEXTROSE 5 % IV SOLN
3.0000 g | INTRAVENOUS | Status: AC
  Administered 2015-04-15: 3 g via INTRAVENOUS
  Filled 2015-04-14: qty 3000

## 2015-04-15 ENCOUNTER — Encounter (HOSPITAL_COMMUNITY)
Admission: RE | Disposition: A | Discharge status: Home / Self Care | Payer: Self-pay | Source: Ambulatory Visit | Attending: Orthopedic Surgery

## 2015-04-15 ENCOUNTER — Ambulatory Visit (HOSPITAL_COMMUNITY): Payer: BC Managed Care – PPO | Admitting: Anesthesiology

## 2015-04-15 ENCOUNTER — Encounter (HOSPITAL_COMMUNITY): Payer: Self-pay | Admitting: *Deleted

## 2015-04-15 ENCOUNTER — Ambulatory Visit (HOSPITAL_COMMUNITY): Payer: BC Managed Care – PPO

## 2015-04-15 ENCOUNTER — Observation Stay (HOSPITAL_COMMUNITY)
Admission: RE | Admit: 2015-04-15 | Discharge: 2015-04-16 | Disposition: A | Discharge status: Home / Self Care | Payer: BC Managed Care – PPO | Source: Ambulatory Visit | Attending: Orthopedic Surgery | Admitting: Orthopedic Surgery

## 2015-04-15 DIAGNOSIS — M4722 Other spondylosis with radiculopathy, cervical region: Secondary | ICD-10-CM | POA: Diagnosis present

## 2015-04-15 DIAGNOSIS — K219 Gastro-esophageal reflux disease without esophagitis: Secondary | ICD-10-CM | POA: Insufficient documentation

## 2015-04-15 DIAGNOSIS — Z6841 Body Mass Index (BMI) 40.0 and over, adult: Secondary | ICD-10-CM | POA: Diagnosis not present

## 2015-04-15 DIAGNOSIS — Z87442 Personal history of urinary calculi: Secondary | ICD-10-CM | POA: Insufficient documentation

## 2015-04-15 DIAGNOSIS — G44029 Chronic cluster headache, not intractable: Secondary | ICD-10-CM | POA: Diagnosis not present

## 2015-04-15 DIAGNOSIS — M5012 Cervical disc disorder with radiculopathy, mid-cervical region: Secondary | ICD-10-CM | POA: Diagnosis not present

## 2015-04-15 DIAGNOSIS — M1990 Unspecified osteoarthritis, unspecified site: Secondary | ICD-10-CM | POA: Insufficient documentation

## 2015-04-15 DIAGNOSIS — Z419 Encounter for procedure for purposes other than remedying health state, unspecified: Secondary | ICD-10-CM

## 2015-04-15 DIAGNOSIS — M542 Cervicalgia: Secondary | ICD-10-CM | POA: Diagnosis present

## 2015-04-15 HISTORY — PX: CERVICAL DISC ARTHROPLASTY: SHX587

## 2015-04-15 SURGERY — CERVICAL ANTERIOR DISC ARTHROPLASTY
Anesthesia: General

## 2015-04-15 MED ORDER — LIDOCAINE HCL (CARDIAC) 20 MG/ML IV SOLN
INTRAVENOUS | Status: AC
  Filled 2015-04-15: qty 5

## 2015-04-15 MED ORDER — HEMOSTATIC AGENTS (NO CHARGE) OPTIME
TOPICAL | Status: DC | PRN
  Administered 2015-04-15: 1 via TOPICAL

## 2015-04-15 MED ORDER — ROCURONIUM BROMIDE 100 MG/10ML IV SOLN
INTRAVENOUS | Status: DC | PRN
  Administered 2015-04-15: 50 mg via INTRAVENOUS

## 2015-04-15 MED ORDER — DEXAMETHASONE SODIUM PHOSPHATE 4 MG/ML IJ SOLN
4.0000 mg | Freq: Four times a day (QID) | INTRAMUSCULAR | Status: DC
  Administered 2015-04-15 (×2): 4 mg via INTRAVENOUS
  Filled 2015-04-15 (×3): qty 1

## 2015-04-15 MED ORDER — DEXAMETHASONE 4 MG PO TABS
4.0000 mg | ORAL_TABLET | Freq: Four times a day (QID) | ORAL | Status: DC
  Administered 2015-04-16: 4 mg via ORAL
  Filled 2015-04-15 (×2): qty 1

## 2015-04-15 MED ORDER — THROMBIN 20000 UNITS EX SOLR
CUTANEOUS | Status: DC | PRN
  Administered 2015-04-15: 20000 mL via TOPICAL

## 2015-04-15 MED ORDER — THROMBIN 20000 UNITS EX KIT: PACK | CUTANEOUS | Status: DC | PRN

## 2015-04-15 MED ORDER — PROPOFOL 10 MG/ML IV BOLUS
INTRAVENOUS | Status: DC | PRN
  Administered 2015-04-15: 200 mg via INTRAVENOUS

## 2015-04-15 MED ORDER — METHOCARBAMOL 500 MG PO TABS: 500.0000 mg | ORAL_TABLET | Freq: Three times a day (TID) | ORAL | Status: DC | PRN

## 2015-04-15 MED ORDER — CEFAZOLIN SODIUM 1-5 GM-% IV SOLN
1.0000 g | Freq: Three times a day (TID) | INTRAVENOUS | Status: AC
  Administered 2015-04-15 – 2015-04-16 (×2): 1 g via INTRAVENOUS
  Filled 2015-04-15 (×2): qty 50

## 2015-04-15 MED ORDER — ACETAMINOPHEN 10 MG/ML IV SOLN
INTRAVENOUS | Status: AC
  Filled 2015-04-15: qty 100

## 2015-04-15 MED ORDER — ACETAMINOPHEN 10 MG/ML IV SOLN
1000.0000 mg | Freq: Four times a day (QID) | INTRAVENOUS | Status: DC
  Administered 2015-04-15 – 2015-04-16 (×3): 1000 mg via INTRAVENOUS
  Filled 2015-04-15 (×4): qty 100

## 2015-04-15 MED ORDER — ONDANSETRON HCL 4 MG/2ML IJ SOLN
INTRAMUSCULAR | Status: DC | PRN
  Administered 2015-04-15 (×2): 4 mg via INTRAVENOUS

## 2015-04-15 MED ORDER — DOCUSATE SODIUM 100 MG PO CAPS: 100.0000 mg | ORAL_CAPSULE | Freq: Three times a day (TID) | ORAL | Status: DC | PRN

## 2015-04-15 MED ORDER — LACTATED RINGERS IV SOLN: INTRAVENOUS | Status: DC

## 2015-04-15 MED ORDER — ONDANSETRON HCL 4 MG/2ML IJ SOLN: 4.0000 mg | INTRAMUSCULAR | Status: DC | PRN

## 2015-04-15 MED ORDER — LACTATED RINGERS IV SOLN
INTRAVENOUS | Status: DC | PRN
  Administered 2015-04-15: 08:00:00 via INTRAVENOUS

## 2015-04-15 MED ORDER — SODIUM CHLORIDE 0.9 % IJ SOLN: 3.0000 mL | INTRAMUSCULAR | Status: DC | PRN

## 2015-04-15 MED ORDER — THROMBIN 20000 UNITS EX SOLR
CUTANEOUS | Status: AC
  Filled 2015-04-15: qty 20000

## 2015-04-15 MED ORDER — MENTHOL 3 MG MT LOZG: 1.0000 | LOZENGE | OROMUCOSAL | Status: DC | PRN

## 2015-04-15 MED ORDER — GLYCOPYRROLATE 0.2 MG/ML IJ SOLN
INTRAMUSCULAR | Status: DC | PRN
  Administered 2015-04-15: 0.4 mg via INTRAVENOUS

## 2015-04-15 MED ORDER — GLYCOPYRROLATE 0.2 MG/ML IJ SOLN
INTRAMUSCULAR | Status: AC
  Filled 2015-04-15: qty 2

## 2015-04-15 MED ORDER — HYDROMORPHONE HCL 1 MG/ML IJ SOLN: 0.2500 mg | INTRAMUSCULAR | Status: DC | PRN

## 2015-04-15 MED ORDER — NEOSTIGMINE METHYLSULFATE 10 MG/10ML IV SOLN
INTRAVENOUS | Status: AC
  Filled 2015-04-15: qty 1

## 2015-04-15 MED ORDER — ARTIFICIAL TEARS OP OINT
TOPICAL_OINTMENT | OPHTHALMIC | Status: AC
  Filled 2015-04-15: qty 3.5

## 2015-04-15 MED ORDER — ACETAMINOPHEN 10 MG/ML IV SOLN
1000.0000 mg | Freq: Four times a day (QID) | INTRAVENOUS | Status: DC
  Administered 2015-04-15: 1000 mg via INTRAVENOUS

## 2015-04-15 MED ORDER — SODIUM CHLORIDE 0.9 % IJ SOLN
3.0000 mL | Freq: Two times a day (BID) | INTRAMUSCULAR | Status: DC
  Administered 2015-04-15 (×2): 3 mL via INTRAVENOUS

## 2015-04-15 MED ORDER — PHENYLEPHRINE HCL 10 MG/ML IJ SOLN
INTRAMUSCULAR | Status: DC | PRN
  Administered 2015-04-15: 120 ug via INTRAVENOUS

## 2015-04-15 MED ORDER — MIDAZOLAM HCL 2 MG/2ML IJ SOLN
INTRAMUSCULAR | Status: AC
  Filled 2015-04-15: qty 2

## 2015-04-15 MED ORDER — NEOSTIGMINE METHYLSULFATE 10 MG/10ML IV SOLN
INTRAVENOUS | Status: DC | PRN
  Administered 2015-04-15: 3 mg via INTRAVENOUS

## 2015-04-15 MED ORDER — DEXAMETHASONE SODIUM PHOSPHATE 4 MG/ML IJ SOLN
INTRAMUSCULAR | Status: DC | PRN
  Administered 2015-04-15: 8 mg via INTRAVENOUS

## 2015-04-15 MED ORDER — ONDANSETRON HCL 4 MG/2ML IJ SOLN
INTRAMUSCULAR | Status: AC
  Filled 2015-04-15: qty 2

## 2015-04-15 MED ORDER — MIDAZOLAM HCL 5 MG/5ML IJ SOLN
INTRAMUSCULAR | Status: DC | PRN
  Administered 2015-04-15: 2 mg via INTRAVENOUS

## 2015-04-15 MED ORDER — DEXAMETHASONE SODIUM PHOSPHATE 4 MG/ML IJ SOLN
INTRAMUSCULAR | Status: AC
  Filled 2015-04-15: qty 2

## 2015-04-15 MED ORDER — PROPOFOL 10 MG/ML IV BOLUS
INTRAVENOUS | Status: AC
  Filled 2015-04-15: qty 20

## 2015-04-15 MED ORDER — METHOCARBAMOL 1000 MG/10ML IJ SOLN
500.0000 mg | Freq: Four times a day (QID) | INTRAVENOUS | Status: DC | PRN
  Filled 2015-04-15: qty 5

## 2015-04-15 MED ORDER — LIDOCAINE HCL (CARDIAC) 20 MG/ML IV SOLN
INTRAVENOUS | Status: DC | PRN
  Administered 2015-04-15: 40 mg via INTRAVENOUS

## 2015-04-15 MED ORDER — FENTANYL CITRATE (PF) 250 MCG/5ML IJ SOLN
INTRAMUSCULAR | Status: AC
  Filled 2015-04-15: qty 5

## 2015-04-15 MED ORDER — PHENYLEPHRINE 40 MCG/ML (10ML) SYRINGE FOR IV PUSH (FOR BLOOD PRESSURE SUPPORT)
PREFILLED_SYRINGE | INTRAVENOUS | Status: AC
  Filled 2015-04-15: qty 10

## 2015-04-15 MED ORDER — OXYCODONE-ACETAMINOPHEN 10-325 MG PO TABS: 1.0000 | ORAL_TABLET | ORAL | Status: DC | PRN

## 2015-04-15 MED ORDER — PHENOL 1.4 % MT LIQD: 1.0000 | OROMUCOSAL | Status: DC | PRN

## 2015-04-15 MED ORDER — PROMETHAZINE HCL 25 MG/ML IJ SOLN: 6.2500 mg | INTRAMUSCULAR | Status: DC | PRN

## 2015-04-15 MED ORDER — ROCURONIUM BROMIDE 50 MG/5ML IV SOLN
INTRAVENOUS | Status: AC
  Filled 2015-04-15: qty 1

## 2015-04-15 MED ORDER — METHOCARBAMOL 500 MG PO TABS
500.0000 mg | ORAL_TABLET | Freq: Four times a day (QID) | ORAL | Status: DC | PRN
  Administered 2015-04-15 – 2015-04-16 (×2): 500 mg via ORAL
  Filled 2015-04-15 (×2): qty 1

## 2015-04-15 MED ORDER — MORPHINE SULFATE 2 MG/ML IJ SOLN: 1.0000 mg | INTRAMUSCULAR | Status: DC | PRN

## 2015-04-15 MED ORDER — BUPIVACAINE-EPINEPHRINE 0.25% -1:200000 IJ SOLN
INTRAMUSCULAR | Status: DC | PRN
  Administered 2015-04-15: 7 mL

## 2015-04-15 MED ORDER — FENTANYL CITRATE (PF) 100 MCG/2ML IJ SOLN
INTRAMUSCULAR | Status: DC | PRN
  Administered 2015-04-15 (×3): 50 ug via INTRAVENOUS
  Administered 2015-04-15: 100 ug via INTRAVENOUS

## 2015-04-15 MED ORDER — ONDANSETRON HCL 4 MG PO TABS: 4.0000 mg | ORAL_TABLET | Freq: Three times a day (TID) | ORAL | Status: DC | PRN

## 2015-04-15 MED ORDER — BUPIVACAINE-EPINEPHRINE (PF) 0.25% -1:200000 IJ SOLN
INTRAMUSCULAR | Status: AC
  Filled 2015-04-15: qty 30

## 2015-04-15 MED ORDER — 0.9 % SODIUM CHLORIDE (POUR BTL) OPTIME
TOPICAL | Status: DC | PRN
  Administered 2015-04-15: 1000 mL

## 2015-04-15 MED ORDER — OXYCODONE HCL 5 MG PO TABS
10.0000 mg | ORAL_TABLET | ORAL | Status: DC | PRN
  Administered 2015-04-15: 10 mg via ORAL
  Filled 2015-04-15: qty 2

## 2015-04-15 SURGICAL SUPPLY — 69 items
ADH SKN CLS APL DERMABOND .7 (GAUZE/BANDAGES/DRESSINGS) ×1
BIT MILLING PRODISC 2.0 STER (BIT) ×2 IMPLANT
BLADE CLIPPER SPEC (BLADE) ×2 IMPLANT
BLADE SURG ROTATE 9660 (MISCELLANEOUS) IMPLANT
BRUSH SCRUB DISP (MISCELLANEOUS) ×2 IMPLANT
BUR EGG ELITE 4.0 (BURR) IMPLANT
BUR EGG ELITE 4.0MM (BURR)
BUR MATCHSTICK NEURO 3.0 LAGG (BURR) IMPLANT
CANISTER SUCTION 2500CC (MISCELLANEOUS) ×3 IMPLANT
CLOSURE WOUND 1/2 X4 (GAUZE/BANDAGES/DRESSINGS) ×1
CORDS BIPOLAR (ELECTRODE) ×3 IMPLANT
COVER MAYO STAND STRL (DRAPES) ×4 IMPLANT
COVER SURGICAL LIGHT HANDLE (MISCELLANEOUS) ×6 IMPLANT
CRADLE DONUT ADULT HEAD (MISCELLANEOUS) ×3 IMPLANT
DERMABOND ADVANCED (GAUZE/BANDAGES/DRESSINGS) ×2
DERMABOND ADVANCED .7 DNX12 (GAUZE/BANDAGES/DRESSINGS) ×1 IMPLANT
DISC PRODISC-C LRG 5MM (Neuro Prosthesis/Implant) ×2 IMPLANT
DRAPE C-ARM 42X72 X-RAY (DRAPES) ×3 IMPLANT
DRAPE POUCH INSTRU U-SHP 10X18 (DRAPES) ×3 IMPLANT
DRAPE SURG 17X23 STRL (DRAPES) ×3 IMPLANT
DRAPE U-SHAPE 47X51 STRL (DRAPES) ×3 IMPLANT
DRSG MEPILEX BORDER 4X4 (GAUZE/BANDAGES/DRESSINGS) ×3 IMPLANT
DURAPREP 26ML APPLICATOR (WOUND CARE) ×3 IMPLANT
ELECT COATED BLADE 2.86 ST (ELECTRODE) ×3 IMPLANT
ELECT PENCIL ROCKER SW 15FT (MISCELLANEOUS) ×3 IMPLANT
ELECT REM PT RETURN 9FT ADLT (ELECTROSURGICAL) ×3
ELECTRODE REM PT RTRN 9FT ADLT (ELECTROSURGICAL) ×1 IMPLANT
GLOVE BIOGEL PI IND STRL 6.5 (GLOVE) IMPLANT
GLOVE BIOGEL PI IND STRL 8 (GLOVE) ×1 IMPLANT
GLOVE BIOGEL PI IND STRL 8.5 (GLOVE) ×1 IMPLANT
GLOVE BIOGEL PI INDICATOR 6.5 (GLOVE) ×2
GLOVE BIOGEL PI INDICATOR 8 (GLOVE) ×2
GLOVE BIOGEL PI INDICATOR 8.5 (GLOVE)
GLOVE ORTHO TXT STRL SZ7.5 (GLOVE) ×1 IMPLANT
GLOVE SS BIOGEL STRL SZ 8.5 (GLOVE) ×1 IMPLANT
GLOVE SUPERSENSE BIOGEL SZ 8.5 (GLOVE) ×2
GLOVE SURG SS PI 6.0 STRL IVOR (GLOVE) ×2 IMPLANT
GOWN STRL REUS W/ TWL LRG LVL3 (GOWN DISPOSABLE) ×1 IMPLANT
GOWN STRL REUS W/TWL 2XL LVL3 (GOWN DISPOSABLE) ×4 IMPLANT
GOWN STRL REUS W/TWL LRG LVL3 (GOWN DISPOSABLE) ×6
IMPL INSERTER TIP LRG 5MM (Insert) IMPLANT
IMPLANT INSERTER TIP LRG 5MM (Insert) ×3 IMPLANT
KIT BASIN OR (CUSTOM PROCEDURE TRAY) ×3 IMPLANT
KIT ROOM TURNOVER OR (KITS) ×3 IMPLANT
NDL SPNL 18GX3.5 QUINCKE PK (NEEDLE) ×1 IMPLANT
NEEDLE SPNL 18GX3.5 QUINCKE PK (NEEDLE) ×3 IMPLANT
NS IRRIG 1000ML POUR BTL (IV SOLUTION) ×3 IMPLANT
PACK ORTHO CERVICAL (CUSTOM PROCEDURE TRAY) ×3 IMPLANT
PACK UNIVERSAL I (CUSTOM PROCEDURE TRAY) ×3 IMPLANT
PAD ARMBOARD 7.5X6 YLW CONV (MISCELLANEOUS) ×6 IMPLANT
PIN RETAINER PRODISC 14 MM (PIN) ×4 IMPLANT
RESTRAINT LIMB HOLDER UNIV (RESTRAINTS) ×2 IMPLANT
SPONGE INTESTINAL PEANUT (DISPOSABLE) ×2 IMPLANT
SPONGE SURGIFOAM ABS GEL 100 (HEMOSTASIS) ×3 IMPLANT
STRIP CLOSURE SKIN 1/2X4 (GAUZE/BANDAGES/DRESSINGS) ×2 IMPLANT
SURGIFLO TRUKIT (HEMOSTASIS) ×2 IMPLANT
SUT BONE WAX W31G (SUTURE) ×3 IMPLANT
SUT MON AB 3-0 SH 27 (SUTURE) ×3
SUT MON AB 3-0 SH27 (SUTURE) ×1 IMPLANT
SUT SILK 3 0 TIES 17X18 (SUTURE) ×3
SUT SILK 3-0 18XBRD TIE BLK (SUTURE) ×1 IMPLANT
SUT VIC AB 2-0 CT1 18 (SUTURE) ×3 IMPLANT
SYR BULB IRRIGATION 50ML (SYRINGE) ×3 IMPLANT
SYR CONTROL 10ML LL (SYRINGE) IMPLANT
TAPE CLOTH 4X10 WHT NS (GAUZE/BANDAGES/DRESSINGS) ×3 IMPLANT
TAPE UMBILICAL COTTON 1/8X30 (MISCELLANEOUS) ×3 IMPLANT
TOWEL OR 17X24 6PK STRL BLUE (TOWEL DISPOSABLE) ×3 IMPLANT
TOWEL OR 17X26 10 PK STRL BLUE (TOWEL DISPOSABLE) ×3 IMPLANT
WATER STERILE IRR 1000ML POUR (IV SOLUTION) ×1 IMPLANT

## 2015-04-15 NOTE — Evaluation (Signed)
Physical Therapy Evaluation Patient Details Name: Tanner Barber MRN: [id number] DOB: June 20, 1971 Today's Date: 04/15/2015   History of Present Illness  pt is a 44 y/o male admitted with radicular arm pain s/p C6-7 ACDF  Clinical Impression  Education has been completed with pt.  Mobility is at supervision level, progressing toward indep.  Not further PT needs.  Will sign off.     Follow Up Recommendations No PT follow up    Equipment Recommendations  None recommended by PT    Recommendations for Other Services       Precautions / Restrictions Precautions Precautions: Cervical Required Braces or Orthoses: Cervical Brace Cervical Brace: Soft collar      Mobility  Bed Mobility Overal bed mobility: Needs Assistance Bed Mobility: Sidelying to Sit;Rolling Rolling: Supervision Sidelying to sit: Supervision       General bed mobility comments: Instructed on logroll and transition side to/from sit.  Pt use Rail first time.  Transfers Overall transfer level: Needs assistance   Transfers: Sit to/from Stand Sit to Stand: Supervision            Ambulation/Gait Ambulation/Gait assistance: Modified independent (Device/Increase time) Ambulation Distance (Feet): 300 Feet Assistive device: None Gait Pattern/deviations: Step-through pattern Gait velocity: moderate   General Gait Details: functional and steady  Stairs Stairs: Yes Stairs assistance: Supervision Stair Management: One rail Right;Alternating pattern;Forwards Number of Stairs: 10 General stair comments: steady with rail  Wheelchair Mobility    Modified Rankin (Stroke Patients Only)       Balance Overall balance assessment: No apparent balance deficits (not formally assessed)                                           Pertinent Vitals/Pain Pain Assessment: Faces Faces Pain Scale: Hurts little more Pain Location: neck Pain Descriptors / Indicators: Grimacing Pain  Intervention(s): Monitored during session    Home Living Family/patient expects to be discharged to:: Private residence Living Arrangements: Spouse/significant other;Children Available Help at Discharge: Family Type of Home: House Home Access: Stairs to enter Entrance Stairs-Rails: None Secretary/administratorntrance Stairs-Number of Steps: 1 Home Layout: One level Home Equipment: None Additional Comments: Works as Production designer, 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    Prior Function Level of Independence: Independent               Higher education careers adviserHand Dominance        Extremity/Trunk Assessment               Lower Extremity Assessment: Overall WFL for tasks assessed         Communication   Communication: No difficulties  Cognition Arousal/Alertness: Awake/alert Behavior During Therapy: WFL for tasks assessed/performed Overall Cognitive Status: Within Functional Limits for tasks assessed                      General Comments General comments (skin integrity, edema, etc.): Instructed in cervical precautions, logroll, lifting restrictions, progression of activity.    Exercises        Assessment/Plan    PT Assessment Patent does not need any further PT services  PT Diagnosis     PT Problem List    PT Treatment Interventions     PT Goals (Current goals can be found in the Care Plan section) Acute Rehab PT Goals PT Goal Formulation: All assessment and education complete, DC therapy    Frequency  Barriers to discharge        Co-evaluation               End of Session   Activity Tolerance: Patient tolerated treatment well Patient left: Other (comment) (with OT) Nurse Communication: Mobility status    Functional Assessment Tool Used: clinical judgement Functional Limitation: Mobility: Walking and moving around Mobility: Walking and Moving Around Current Status ([id number](G8978): At least 1 percent but less than 20 percent impaired, limited or restricted Mobility: Walking and Moving  Around Goal Status [phone](G8979): At least 1 percent but less than 20 percent impaired, limited or restricted Mobility: Walking and Moving Around Discharge Status [phone](G8980): At least 1 percent but less than 20 percent impaired, limited or restricted    Time: 1645-1705 PT Time Calculation (min) (ACUTE ONLY): 20 min   Charges:   PT Evaluation $Initial PT Evaluation Tier I: 1 Procedure     PT G Codes:   PT G-Codes **NOT FOR INPATIENT CLASS** Functional Assessment Tool Used: clinical judgement Functional Limitation: Mobility: Walking and moving around Mobility: Walking and Moving Around Current Status ([id number](G8978): At least 1 percent but less than 20 percent impaired, limited or restricted Mobility: Walking and Moving Around Goal Status [phone](G8979): At least 1 percent but less than 20 percent impaired, limited or restricted Mobility: Walking and Moving Around Discharge Status [phone](G8980): At least 1 percent but less than 20 percent impaired, limited or restricted    Elayah Klooster, Eliseo GumKenneth V 04/15/2015, 5:14 PM 04/15/2015  [place] BingKen Hadrian Yarbrough, PT [phone]  (pager)

## 2015-04-15 NOTE — Anesthesia Procedure Notes (Signed)
Procedure Name: Intubation Date/Time: 04/15/2015 8:43 AM Performed by: Quentin OreWALKER, Kristoffer Bala E Pre-anesthesia Checklist: Patient identified, Emergency Drugs available, Suction available, Patient being monitored and Timeout performed Patient Re-evaluated:Patient Re-evaluated prior to inductionOxygen Delivery Method: Circle system utilized Preoxygenation: Pre-oxygenation with 100% oxygen Intubation Type: IV induction Ventilation: Mask ventilation without difficulty Laryngoscope Size: Mac and 4 Grade View: Grade II Tube type: Oral Tube size: 7.5 mm Number of attempts: 1 Airway Equipment and Method: Stylet Placement Confirmation: ETT inserted through vocal cords under direct vision,  positive ETCO2 and breath sounds checked- equal and bilateral Secured at: 22 cm Tube secured with: Tape Dental Injury: Teeth and Oropharynx as per pre-operative assessment

## 2015-04-15 NOTE — Anesthesia Preprocedure Evaluation (Addendum)
Anesthesia Evaluation  Patient identified by MRN, date of birth, ID band Patient awake    Reviewed: Allergy & Precautions, NPO status , Patient's Chart, lab work & pertinent test results  Airway Mallampati: I  TM Distance: <3 FB Neck ROM: Full    Dental no notable dental hx. (+) Teeth Intact   Pulmonary neg pulmonary ROS,  breath sounds clear to auscultation  Pulmonary exam normal       Cardiovascular negative cardio ROS Normal cardiovascular examRhythm:Regular Rate:Normal     Neuro/Psych negative neurological ROS  negative psych ROS   GI/Hepatic negative GI ROS, Neg liver ROS,   Endo/Other  Morbid obesity  Renal/GU negative Renal ROS  negative genitourinary   Musculoskeletal negative musculoskeletal ROS (+)   Abdominal   Peds negative pediatric ROS (+)  Hematology negative hematology ROS (+)   Anesthesia Other Findings   Reproductive/Obstetrics negative OB ROS                           Anesthesia Physical Anesthesia Plan  ASA: II  Anesthesia Plan:    Post-op Pain Management:    Induction: Intravenous  Airway Management Planned: Oral ETT  Additional Equipment:   Intra-op Plan:   Post-operative Plan: Extubation in OR  Informed Consent: I have reviewed the patients History and Physical, chart, labs and discussed the procedure including the risks, benefits and alternatives for the proposed anesthesia with the patient or authorized representative who has indicated his/her understanding and acceptance.   Dental advisory given  Plan Discussed with: CRNA and Surgeon  Anesthesia Plan Comments:         Anesthesia Quick Evaluation

## 2015-04-15 NOTE — Transfer of Care (Signed)
Immediate Anesthesia Transfer of Care Note  Patient: Tanner Barber  Procedure(s) Performed: Procedure(s): CERVICAL ANTERIOR DISC ARTHROPLASTY C6 - C7 (N/A)  Patient Location: PACU  Anesthesia Type:General  Level of Consciousness: awake, alert  and oriented  Airway & Oxygen Therapy: Patient Spontanous Breathing and Patient connected to nasal cannula oxygen  Post-op Assessment: Report given to RN, Post -op Vital signs reviewed and stable and Patient moving all extremities X 4  Post vital signs: Reviewed and stable  Last Vitals:  Filed Vitals:   04/15/15 1135  BP: 139/83  Pulse: 98  Temp: 36.2 C  Resp: 16    Complications: No apparent anesthesia complications

## 2015-04-15 NOTE — Brief Op Note (Signed)
04/15/2015  11:24 AM  PATIENT:  Tanner Barber  44 y.o. 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   PROCEDURE:  Procedure(s): CERVICAL ANTERIOR DISC ARTHROPLASTY C6 - C7 (N/A)  SURGEON:  Surgeon(s) and Role:    * Venita Lickahari Tracyann Duffell, MD - Primary  PHYSICIAN ASSISTANT:   ASSISTANTS: none   ANESTHESIA:   general  EBL:  Total I/O In: -  Out: 100 [Blood:100]  BLOOD ADMINISTERED:none  DRAINS: none   LOCAL MEDICATIONS USED:  MARCAINE     SPECIMEN:  No Specimen  DISPOSITION OF SPECIMEN:  N/A  COUNTS:  YES  TOURNIQUET:  * No tourniquets in log *  DICTATION: .Other Dictation: Dictation Number [id number]  PLAN OF CARE: Admit for overnight observation  PATIENT DISPOSITION:  PACU - hemodynamically stable.

## 2015-04-15 NOTE — Plan of Care (Signed)
Problem: Consults Goal: Diagnosis - Spinal Surgery Outcome: Completed/Met Date Met:  04/15/15 Cervical Spine Fusion

## 2015-04-15 NOTE — Discharge Instructions (Signed)

## 2015-04-15 NOTE — Evaluation (Signed)
Occupational Therapy Evaluation and Discharge Patient Details Name: Tanner Barber MRN: [id number] DOB: [birth year] Today's Date: 04/15/2015    History of Present Illness pt is a 44 y/o male admitted with radicular arm pain s/p C6-7 ACDF   Clinical Impression   PTA pt was independent with ADLs. Pt currently at Mod I and educated on cervical precautions, safety with ADLs, and management of cervical collar. No further acute OT needs.     Follow Up Recommendations  No OT follow up    Equipment Recommendations  None recommended by OT    Recommendations for Other Services       Precautions / Restrictions Precautions Precautions: Cervical Required Braces or Orthoses: Cervical Brace Cervical Brace: Soft collar Restrictions Weight Bearing Restrictions: No      Mobility Bed Mobility Overal bed mobility: Needs Assistance Bed Mobility: Sidelying to Sit;Rolling Rolling: Supervision Sidelying to sit: Supervision       General bed mobility comments: Pt received in hallway. Verbalized log roll technique  Transfers Overall transfer level: Modified independent   Transfers: Sit to/from Stand Sit to Stand: Supervision              Balance Overall balance assessment: No apparent balance deficits (not formally assessed)                                          ADL Overall ADL's : Modified independent                                       General ADL Comments: Pt at Mod I for ADLs and educated on compensatory techniques for ADLs and cervical collar management. Pt is progressing well and hopes to return to work soon. Educated on all safety aspects.      Vision Additional Comments: No change from baseline.           Pertinent Vitals/Pain Pain Assessment: Faces Faces Pain Scale: Hurts a little bit Pain Location: neck Pain Descriptors / Indicators: Discomfort Pain Intervention(s): Monitored during session     Hand Dominance      Extremity/Trunk Assessment Upper Extremity Assessment Upper Extremity Assessment: Overall WFL for tasks assessed   Lower Extremity Assessment Lower Extremity Assessment: Overall WFL for tasks assessed   Cervical / Trunk Assessment Cervical / Trunk Assessment: Normal   Communication Communication Communication: No difficulties   Cognition Arousal/Alertness: Awake/alert Behavior During Therapy: WFL for tasks assessed/performed Overall Cognitive Status: Within Functional Limits for tasks assessed                                Home Living Family/patient expects to be discharged to:: Private residence Living Arrangements: Spouse/significant other;Children Available Help at Discharge: Family Type of Home: House Home Access: Stairs to enter Secretary/administratorntrance Stairs-Number of Steps: 1 Entrance Stairs-Rails: None Home Layout: One level     Bathroom Shower/Tub: Estate manager/land agentWalk-in shower     Bathroom Accessibility: Yes   Home Equipment: Information systems managerhower seat - built in;Grab bars - tub/shower   Additional Comments: Works as Production designer, 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      Prior Functioning/Environment Level of Independence: Independent             OT Diagnosis: Generalized weakness;Acute pain    End of Session  Equipment Utilized During Treatment: Cervical collar  Activity Tolerance: Patient tolerated treatment well Patient left: Other (comment) (ambulating in hallway with family)   Time: [phone] OT Time Calculation (min): 14 min Charges:  OT General Charges $OT Visit: 1 Procedure OT Evaluation $Initial OT Evaluation Tier I: 1 Procedure G-Codes: OT G-codes **NOT FOR INPATIENT CLASS** Functional Assessment Tool Used: clinical judgement Functional Limitation: Self care Self Care Current Status (V4098(G8987): 0 percent impaired, limited or restricted Self Care Goal Status (J1914(G8988): 0 percent impaired, limited or restricted Self Care Discharge Status (N8295(G8989): 0 percent impaired, limited or  restricted  [name], [name] 04/15/2015, 5:20 PM  [name], OTR/L Occupational Therapist [phone] (pager)

## 2015-04-15 NOTE — H&P (Signed)
History of Present Illness   The patient is a 44 year old male who comes in today for a preoperative History and Physical. The patient is scheduled for a TDR C6-7 to be performed by Dr. Debria Garretahari D. Shon BatonBrooks, [facility] on 04-15-[facility] . Please see the hospital record for complete dictated history and physical.  Allergies  No Known Drug Allergies10/21/2015  Family History  Heart disease in male family member before age 46 Heart Disease Father, Maternal Grandfather, Paternal Grandfather. Hypertension Father, Maternal Grandfather, Paternal Grandfather. Osteoporosis Maternal Grandmother, Mother. Osteoarthritis Father, Mother. Cerebrovascular Accident Paternal Grandfather. Cancer Maternal Grandfather. Chronic Obstructive Lung Disease Maternal Grandmother. First Degree Relatives reported Depression Maternal Grandmother.  Social History Exercise Exercises weekly; does running / walking Children 2 Current work status working full time Tobacco use Never smoker. 09/24/2014 Number of flights of stairs before winded greater than 5 Current drinker 09/24/2014: Currently drinks beer only occasionally per week Marital status married Most recent primary occupation Production designer, theatre/television/filmManager - Respiratory Therapy Not under pain contract Living situation live with spouse No history of drug/alcohol rehab  Medication History No Current Medications (Taken starting 04/10/2015) Medications Reconciled  Past Surgical History  Tonsillectomy Arthroscopy of Knee bilateral  Other Problems Sleep Apnea Closed fracture of greater tuberosity of right humerus (Z61.096E(S42.251A) Cervical radiculitis (M54.12) Migraine Headache Autoimmune disorder Mother. Gastroesophageal Reflux Disease Kidney Stone  Vitals  04/10/2015 8:09 AM Weight: 262 lb Height: 69in Body Surface Area: 2.32 m Body Mass Index: 38.69 kg/m  Temp.: 98.30F(Oral)  Pulse: 82 (Regular)  BP: 132/89 (Sitting, Left  Arm, Standard)  Objective Transcription On clinical exam, he is a pleasant gentleman, appears younger than his stated age. He is alert. He is oriented x3. He still has right radicular arm pain, especially with dysesthesias down the triceps and into the extensor surface of the forearm. He has got some trace weakness of his right triceps as well as the significant neck pain, especially with range of motion. No significant humeral pain, status post right humeral fracture. No shortness of breath or chest pain. Lungs are clear to auscultation. Heart, regular rate and rhythm. No rubs, gallops, or murmurs. He has intact peripheral pulses throughout, normal gait pattern.  MRI of his cervical spine from 03/14/2015 was reviewed. He continues to have severe right foraminal stenosis due to a right posterolateral hard disc osteophyte extrusion. No cord signal changes. Unchanged degenerative changes at C4-5 and C5-6.  Assessment & Plan   Plans Transcription At this point in time, clinically his symptoms are more consistent with C7 radiculopathy due to the disc extrusion and right foraminal nerve compression. We have discussed ongoing nonoperative management and at this point his quality of life is suffered to the point where he did not want to proceed that way. We have again addressed surgical options. At this point after reviewing fusion data, cervical disc replacement one-level versus multilevel, he and his wife would like to proceed with the one-level cervical disc replacement. I do think that is a very reasonable option.At this point in time, we have gone over the video about the ProDisc implantation. All of his questions were addressed, we will be planning on moving forward with the disc replacement. I did reiterate to him that if for technical reasons the disc was not fitting correctly, then we would be allowed to proceed with the fusion. All of his questions were encouraged and addressed. We have discussed the  risks again, which include infection, bleeding, nerve damage, death, stroke, paralysis, failure  to heal, need for further surgery, failure to move forward with the disc and move forward with fusion, throat pain, swallowing difficulty, hoarseness in the voice.

## 2015-04-15 NOTE — Anesthesia Postprocedure Evaluation (Signed)
  Anesthesia Post-op Note  Patient: Tanner CraneRoger E Hail  Procedure(s) Performed: Procedure(s) (LRB): CERVICAL ANTERIOR DISC ARTHROPLASTY C6 - C7 (N/A)  Patient Location: PACU  Anesthesia Type: General  Level of Consciousness: awake and alert   Airway and Oxygen Therapy: Patient Spontanous Breathing  Post-op Pain: mild  Post-op Assessment: Post-op Vital signs reviewed, Patient's Cardiovascular Status Stable, Respiratory Function Stable, Patent Airway and No signs of Nausea or vomiting  Last Vitals:  Filed Vitals:   04/15/15 1210  BP: 131/74  Pulse: 94  Temp: 36.8 C  Resp: 15    Post-op Vital Signs: stable   Complications: No apparent anesthesia complications

## 2015-04-16 ENCOUNTER — Encounter (HOSPITAL_COMMUNITY): Payer: Self-pay | Admitting: Orthopedic Surgery

## 2015-04-16 DIAGNOSIS — M4722 Other spondylosis with radiculopathy, cervical region: Secondary | ICD-10-CM | POA: Diagnosis not present

## 2015-04-16 NOTE — Progress Notes (Signed)
    Subjective: Procedure(s) (LRB): CERVICAL ANTERIOR DISC ARTHROPLASTY C6 - C7 (N/A) 1 Day Post-Op  Patient reports pain as 2 on 0-10 scale.  Reports decreased arm pain reports incisional neck pain   Positive void Negative, bowel movement Positive flatus Negative chest pain or shortness of breath  Objective: Vital signs in last 24 hours: Temp:  [97.5 F (36.4 C)-98.8 F (37.1 C)] 97.7 F (36.5 C) (05/12 0823) Pulse Rate:  [85-116] 85 (05/12 0823) Resp:  [15-20] 18 (05/12 0823) BP: (108-131)/(58-85) 128/85 mmHg (05/12 0823) SpO2:  [94 %-100 %] 97 % (05/12 0823)  Intake/Output from previous day: 05/11 0701 - 05/12 0700 In: 1880 [P.O.:1080; I.V.:800] Out: 100 [Blood:100]  Labs: No results for input(s): WBC, RBC, HCT, PLT in the last 72 hours. No results for input(s): NA, K, CL, CO2, BUN, CREATININE, GLUCOSE, CALCIUM in the last 72 hours. No results for input(s): LABPT, INR in the last 72 hours.  Physical Exam: Neurologically intact ABD soft Neurovascular intact Intact pulses distally Incision: dressing C/D/I and no drainage Compartment soft  Assessment/Plan: Patient stable  xrays n/a Mobilization with physical therapy Encourage incentive spirometry Continue care  Advance diet Up with therapy  F/u in 2 weeks Doing well - ok for d/c to home  Venita Lickahari Ky Moskowitz, MD [facility] [phone]

## 2015-04-16 NOTE — Op Note (Signed)
NAMVenia Minks:  Tanner Barber, Tanner Barber                ACCOUNT NO.:  [id number]  MEDICAL RECORD NO.:  [id number]  LOCATION:  3C05C                        FACILITY:  [facility]  PHYSICIAN:  Fergie Sherbert D. Shon BatonBrooks, M.D. DATE OF BIRTH:  November 19, 1971  DATE OF PROCEDURE:  04/15/2015 DATE OF DISCHARGE:                              OPERATIVE REPORT   PREOPERATIVE DIAGNOSIS:  Cervical radiculopathy, C6-7.  POSTOPERATIVE DIAGNOSIS:  Cervical radiculopathy, C6-7.  OPERATIVE PROCEDURE:  Cervical total disk arthroplasty, C6-7.  COMPLICATIONS:  None.  CONDITION:  Stable.  IMPLANT USED:  DePuy Proteus cervical, size 5 wide.  COMPLICATIONS:  None.  CONDITION:  Stable.  HISTORY:  This is a very pleasant gentleman who has been under my care for some time now.  Attempts at conservative management failed to alleviate his neck and radicular arm pain.  All appropriate risks, benefits, and alternatives were discussed with the patient.  Consent was obtained.  Please refer to my office dictation for specifics on conservative management.  OPERATIVE NOTE:  The patient was brought to the operating room, placed supine on the operating table.  After successful induction of general anesthesia and endotracheal intubation, TEDs, SCDs were applied.  The patient was placed supine on the operating table and an inflatable pressure bag was placed underneath the scapula.  The anterior cervical spine was prepped and draped in a standard fashion.  Time-out was taken to confirm patient, procedure, and all other pertinent important data. Once this was completed, I used x-ray to identify the C6-7 disk space. I infiltrated the proposed transverse incision site.  I then incised the anterior cervical spine starting at the midline proceeding in a transverse fashion to the left.  Sharp dissection was carried out through the adipose tissue to the platysma.  Platysma was isolated and incised and I exposed the medial border of the sternocleidomastoid.   I then proceeded with a standard Smith-Robinson approach to the anterior cervical spine dissecting along the medial border of the sternocleidomastoid sweeping the esophagus identifying the omohyoid.  I then released the omohyoid from its sleeve and continued dissecting through the deep cervical and prevertebral fascia.  I could now palpate the anterior cervical spine.  I swept the esophagus to the right and protected it with a finger.  I then placed a retractor and identified the carotid artery pulsation and protected that with a finger.  I then placed a needle into the C6-7 disk space and took an intraoperative x- ray to confirm I was at the appropriate level.  Once this was confirmed, I used bipolar electrocautery to mobilize the longus colli muscle from the midbody of 6 to the midbody of 7 bilaterally.  Once I had the lateral margins of the uncovertebral joint identified, I then placed a self-retaining retractor with self-retaining Cloward retractor underneath the longus colli muscle, deflated the endotracheal cuff, and expanded the retractor to the appropriate width and had excellent visualization of the C6-7 annulus.  An annulotomy was performed with a 15-blade scalpel then using a combination of pituitary rongeurs, curettes, and Kerrison rongeurs, I removed all of the disk material.  I then removed the overhanging osteophyte from the inferior aspect of the C6 vertebral body.  I then placed distraction pins into the bodies of C6 and C7.  Using AP plane, I identified the midportion of the cervical body based on the location of the spinous process, and I made sure this is where I placed my distraction pin parallel to the endplate.  Once I had both distraction pins parallel to the endplate in the midline, I then distracted the intervertebral space and then expanded the retractor to the appropriate width.  I then continued using a fine nerve hook to release the posterior aspect of the  annulus.  I then used a fine nerve curette to release the remaining portion of the posterior annulus.  With this done, I was then able to use a fine nerve hook to develop a plane underneath the annulus and posterior longitudinal ligament.  I then used my 1 mm Kerrison to resect the posterior longitudinal ligament.  This allowed me to go out underneath the right uncovertebral joint and decompress this.  I could now freely sweep my small nerve hook underneath the uncovertebral joint on that right side, which was the symptomatic side.  With the diskectomy complete, I then made sure I had removed all the cartilaginous endplates.  Once this was completed, I then trialed with the appropriate size implant and then elected to use the 5 wide Proteus implant.  This did not overstuff the compartment or distract the facet joint and it gave appropriate width and depth.  Once this was done, I then used the high-speed burr to create the fin cuts.  I then removed the trial device and cleared out the fin cuts.  I irrigated it copiously with normal saline and made sure there were no free fragments of material pushed backwards.  Once this was done, I obtained the actual implant and malleted it down to the appropriate depth.  This was just anterior to the posterior vertebral body.  With the implant properly seated, I removed the inserting device and placed bone wax over the exposed bone edges to confirm hemostasis.  I then irrigated it copiously normal saline, used bipolar electrocautery and FloSeal to maintain hemostasis.  I then irrigated copiously with normal saline and again checked to ensure that there was no further bleeding. I removed the retractors and then returned the trachea and esophagus to midline.  At this point, final x-rays were taken, which were satisfactory.  The implant was in the midline on the AP view and appropriate depth on the lateral.  At this point, I then closed the platysma with  interrupted 2-0 Vicryl sutures and skin was closed with 3- 0 Monocryl.  Steri-Strips and dry dressing were applied.  The patient was extubated, transferred to the PACU without incident.  At the end of the case, all needle and sponge counts were correct.     Sheryl Towell D. Shon BatonBrooks, M.D.     DDB/MEDQ  D:  04/15/2015  T:  04/16/2015  Job:  [id number]  cc:   Debria Garretahari D. Shon BatonBrooks, M.D.

## 2015-04-16 NOTE — Progress Notes (Signed)
Pt doing well. Pt and wife given D/C instructions with Rx's, verbal understanding was provided. Pt's IV was removed prior to D/C. Pt's incision is clean and dry with no sign of infection. Pt D/C'd home via walking @ 1150 per MD order. Pt is stable @ D/C and has no other needs at this time. Rema FendtAshley Clarke Amburn, RN

## 2015-04-17 NOTE — Discharge Summary (Signed)
Patient ID: Tanner CraneRoger E Ditmars MRN: [id number] DOB/AGE: 1971/11/22 43 y.o.  Admit date: 04/15/2015 Discharge date: 04/17/2015  Admission Diagnoses:  Active Problems:   Neck pain   Discharge Diagnoses:  Active Problems:   Neck pain  status post Procedure(s): CERVICAL ANTERIOR DISC ARTHROPLASTY C6 - C7  Past Medical History  Diagnosis Date  . Arthritis   . Headache, cluster   . Complication of anesthesia     slow to wake up once  . PONV (postoperative nausea and vomiting)   . Fracture, humerus closed 11/2014  . Cluster headaches     daily  . DDD (degenerative disc disease), cervical   . Obesity, Class III, BMI 40-49.9 (morbid obesity)     Surgeries: Procedure(s): CERVICAL ANTERIOR DISC ARTHROPLASTY C6 - C7 on 04/15/2015   Consultants:    Discharged Condition: Improved  Hospital Course: Tanner Barber is an 44 y.o. male who was admitted 04/15/2015 for operative treatment of <principal problem not specified>. Patient failed conservative treatments (please see the history and physical for the specifics) and had severe unremitting pain that affects sleep, daily activities and work/hobbies. After pre-op clearance, the patient was taken to the operating room on 04/15/2015 and underwent  Procedure(s): CERVICAL ANTERIOR DISC ARTHROPLASTY C6 - C7.    Patient was given perioperative antibiotics:  Anti-infectives    Start     Dose/Rate Route Frequency Ordered Stop   04/15/15 1700  ceFAZolin (ANCEF) IVPB 1 g/50 mL premix     1 g 100 mL/hr over 30 Minutes Intravenous Every 8 hours 04/15/15 1233 04/16/15 0030   04/15/15 0700  ceFAZolin (ANCEF) 3 g in dextrose 5 % 50 mL IVPB     3 g 160 mL/hr over 30 Minutes Intravenous To Surgery 04/14/15 1534 04/15/15 0850       Patient was given sequential compression devices and early ambulation to prevent DVT.   Patient benefited maximally from hospital stay and there were no complications. At the time of discharge, the patient was  urinating/moving their bowels without difficulty, tolerating a regular diet, pain is controlled with oral pain medications and they have been cleared by PT/OT.   Recent vital signs: No data found.    Recent laboratory studies: No results for input(s): WBC, HGB, HCT, PLT, NA, K, CL, CO2, BUN, CREATININE, GLUCOSE, INR, CALCIUM in the last 72 hours.  Invalid input(s): PT, 2   Discharge Medications:     Medication List    STOP taking these medications        naproxen 500 MG tablet  Commonly known as:  NAPROSYN     oxyCODONE-acetaminophen 5-325 MG per tablet  Commonly known as:  PERCOCET  Replaced by:  oxyCODONE-acetaminophen 10-325 MG per tablet      TAKE these medications        docusate sodium 100 MG capsule  Commonly known as:  COLACE  Take 1 capsule (100 mg total) by mouth 3 (three) times daily as needed for mild constipation.     methocarbamol 500 MG tablet  Commonly known as:  ROBAXIN  Take 1 tablet (500 mg total) by mouth 3 (three) times daily as needed for muscle spasms.     ondansetron 4 MG tablet  Commonly known as:  ZOFRAN  Take 1 tablet (4 mg total) by mouth every 8 (eight) hours as needed for nausea or vomiting.     oxyCODONE-acetaminophen 10-325 MG per tablet  Commonly known as:  PERCOCET  Take 1 tablet by mouth every  4 (four) hours as needed for pain.        Diagnostic Studies: Dg Cervical Spine 2-3 Views  04/15/2015   CLINICAL DATA:  C6-7 disc replacement.  EXAM: DG C-ARM 61-120 MIN; CERVICAL SPINE - 2-3 VIEW  COMPARISON:  None.  FINDINGS: AP and lateral intraoperative views. endotracheal tube. Disc replacement at C6-7. No acute hardware complication.  IMPRESSION: Intraoperative imaging of C6-7 disc replacement.   Electronically Signed   By: Jeronimo GreavesKyle  Talbot M.D.   On: 04/15/2015 11:44   Dg C-arm 1-60 Min  04/15/2015   CLINICAL DATA:  C6-7 disc replacement.  EXAM: DG C-ARM 61-120 MIN; CERVICAL SPINE - 2-3 VIEW  COMPARISON:  None.  FINDINGS: AP and lateral  intraoperative views. endotracheal tube. Disc replacement at C6-7. No acute hardware complication.  IMPRESSION: Intraoperative imaging of C6-7 disc replacement.   Electronically Signed   By: Jeronimo GreavesKyle  Talbot M.D.   On: 04/15/2015 11:44          Follow-up Information    Follow up with Alvy BealBROOKS,Cicilia Clinger D, MD. Schedule an appointment as soon as possible for a visit in 2 weeks.   Specialty:  Orthopedic Surgery   Why:  For suture removal, For wound re-check   Contact information:   [address] [place] [address] [phone]       Discharge Plan:  discharge to home  Disposition: doing well  Hospital course uneventful    Signed: Venita LickBROOKS,Facundo Allemand D for Dr. Venita Lickahari Marlo Arriola [facility] [phone] 04/17/2015, 5:42 PM

## 2016-12-08 ENCOUNTER — Encounter (HOSPITAL_COMMUNITY): Payer: Self-pay | Admitting: Emergency Medicine

## 2016-12-08 ENCOUNTER — Emergency Department (HOSPITAL_COMMUNITY)
Admission: EM | Admit: 2016-12-08 | Discharge: 2016-12-08 | Disposition: A | Discharge status: Home / Self Care | Payer: BC Managed Care – PPO | Attending: Emergency Medicine | Admitting: Emergency Medicine

## 2016-12-08 ENCOUNTER — Emergency Department (HOSPITAL_COMMUNITY): Payer: BC Managed Care – PPO

## 2016-12-08 DIAGNOSIS — R109 Unspecified abdominal pain: Secondary | ICD-10-CM | POA: Diagnosis present

## 2016-12-08 DIAGNOSIS — N200 Calculus of kidney: Secondary | ICD-10-CM | POA: Insufficient documentation

## 2016-12-08 LAB — CBC
HCT: 48.8 % (ref 39.0–52.0)
Hemoglobin: 16 g/dL (ref 13.0–17.0)
MCH: 31.3 pg (ref 26.0–34.0)
MCHC: 32.8 g/dL (ref 30.0–36.0)
MCV: 95.5 fL (ref 78.0–100.0)
Platelets: 261 10*3/uL (ref 150–400)
RBC: 5.11 MIL/uL (ref 4.22–5.81)
RDW: 12.6 % (ref 11.5–15.5)
WBC: 12 10*3/uL — AB (ref 4.0–10.5)

## 2016-12-08 LAB — URINALYSIS, ROUTINE W REFLEX MICROSCOPIC
Bilirubin Urine: NEGATIVE
Glucose, UA: NEGATIVE mg/dL
KETONES UR: NEGATIVE mg/dL
Leukocytes, UA: NEGATIVE
NITRITE: NEGATIVE
Protein, ur: NEGATIVE mg/dL
Specific Gravity, Urine: 1.02 (ref 1.005–1.030)
pH: 5.5 (ref 5.0–8.0)

## 2016-12-08 LAB — BASIC METABOLIC PANEL
Anion gap: 8 (ref 5–15)
BUN: 10 mg/dL (ref 6–20)
CALCIUM: 9.3 mg/dL (ref 8.9–10.3)
CO2: 27 mmol/L (ref 22–32)
CREATININE: 1.41 mg/dL — AB (ref 0.61–1.24)
Chloride: 105 mmol/L (ref 101–111)
GFR calc non Af Amer: 58 mL/min — ABNORMAL LOW (ref >60)
Glucose, Bld: 97 mg/dL (ref 65–99)
Potassium: 3.6 mmol/L (ref 3.5–5.1)
Sodium: 140 mmol/L (ref 135–145)

## 2016-12-08 LAB — URINALYSIS, MICROSCOPIC (REFLEX)
Squamous Epithelial / LPF: NONE SEEN
WBC UA: NONE SEEN WBC/hpf (ref 0–5)

## 2016-12-08 MED ORDER — HYDROMORPHONE HCL 1 MG/ML IJ SOLN
1.0000 mg | Freq: Once | INTRAMUSCULAR | Status: AC
Start: 2016-12-08 — End: 2016-12-08
  Administered 2016-12-08: 1 mg via INTRAVENOUS
  Filled 2016-12-08: qty 1

## 2016-12-08 MED ORDER — HYDROCODONE-ACETAMINOPHEN 5-325 MG PO TABS: 1.0000 | ORAL_TABLET | Freq: Four times a day (QID) | ORAL | 0 refills | Status: AC | PRN

## 2016-12-08 MED ORDER — ONDANSETRON HCL 4 MG/2ML IJ SOLN
4.0000 mg | Freq: Once | INTRAMUSCULAR | Status: AC
  Administered 2016-12-08: 4 mg via INTRAVENOUS
  Filled 2016-12-08: qty 2

## 2016-12-08 MED ORDER — ONDANSETRON HCL 4 MG PO TABS: 4.0000 mg | ORAL_TABLET | Freq: Three times a day (TID) | ORAL | 0 refills | Status: AC | PRN

## 2016-12-08 NOTE — ED Provider Notes (Signed)
[facility]EM[facility] DEPT Provider Note   CSN: [id number] Arrival date & time: 12/08/16  7829     History   Chief Complaint Chief Complaint  Patient presents with  . Flank Pain    HPI Tanner Barber is a 46 y.o. male.  Patient presents to the emergency department with chief complaint of left flank pain. He states the pain awakened him from sleep this morning around 4 AM. He reports that it feels like he has a spasm in his back that radiates to his left testicle. He also describes a stabbing sensation in his back and a throbbing pain to his testicle and penis. He reports having had similar symptoms in the past, and was diagnosed with so as muscle spasm. He has never had a kidney stone before. He denies any dysuria or hematuria. He has not taken anything for symptoms. He reports he feels better when he stands up, and worse sitting down.   The history is provided by the patient. No language interpreter was used.    Past Medical History:  Diagnosis Date  . Arthritis   . Cluster headaches    daily  . Complication of anesthesia    slow to wake up once  . DDD (degenerative disc disease), cervical   . Fracture, humerus closed 11/2014  . Headache, cluster   . Obesity, Class III, BMI 40-49.9 (morbid obesity) (HCC)   . PONV (postoperative nausea and vomiting)     Patient Active Problem List   Diagnosis Date Noted  . Neck pain 04/15/2015    Past Surgical History:  Procedure Laterality Date  . CERVICAL DISC ARTHROPLASTY N/A 04/15/2015   Procedure: CERVICAL ANTERIOR DISC ARTHROPLASTY C6 - C7;  Surgeon: Venita Lick, MD;  Location: [facility] OR;  Service: Orthopedics;  Laterality: N/A;  . KNEE ARTHROSCOPY     5 knee scopes  . TONSILLECTOMY         Home Medications    Prior to Admission medications   Medication Sig Start Date End Date Taking? Authorizing Provider  docusate sodium (COLACE) 100 MG capsule Take 1 capsule (100 mg total) by mouth 3 (three) times daily as needed for mild  constipation. 04/15/15   Venita Lick, MD  methocarbamol (ROBAXIN) 500 MG tablet Take 1 tablet (500 mg total) by mouth 3 (three) times daily as needed for muscle spasms. 04/15/15   Venita Lick, MD  ondansetron (ZOFRAN) 4 MG tablet Take 1 tablet (4 mg total) by mouth every 8 (eight) hours as needed for nausea or vomiting. 04/15/15   Venita Lick, MD  oxyCODONE-acetaminophen (PERCOCET) 10-325 MG per tablet Take 1 tablet by mouth every 4 (four) hours as needed for pain. 04/15/15   Venita Lick, MD    Family History No family history on file.  Social History Social History  Substance Use Topics  . Smoking status: Never Smoker  . Smokeless tobacco: Never Used  . Alcohol use Yes     Comment: rarely     Allergies   Patient has no known allergies.   Review of Systems Review of Systems  All other systems reviewed and are negative.    Physical Exam Updated Vital Signs BP 135/82 (BP Location: Left Arm)   Pulse 95   Temp 98.4 F (36.9 C) (Oral)   Resp 20   Ht 5\' 7"  (1.702 m)   Wt 113.4 kg   SpO2 96%   BMI 39.16 kg/m   Physical Exam  Constitutional: He is oriented to person, place, and time. He  appears well-developed and well-nourished.  HENT:  Head: Normocephalic and atraumatic.  Eyes: Conjunctivae and EOM are normal. Pupils are equal, round, and reactive to light. Right eye exhibits no discharge. Left eye exhibits no discharge. No scleral icterus.  Neck: Normal range of motion. Neck supple. No JVD present.  Cardiovascular: Normal rate, regular rhythm and normal heart sounds.  Exam reveals no gallop and no friction rub.   No murmur heard. Pulmonary/Chest: Effort normal and breath sounds normal. No respiratory distress. He has no wheezes. He has no rales. He exhibits no tenderness.  Abdominal: Soft. He exhibits no distension and no mass. There is no tenderness. There is no rebound and no guarding.  No tenderness to palpation on exam  Genitourinary:  Genitourinary Comments:  No testicular tenderness, mass, or abnormality  Musculoskeletal: Normal range of motion. He exhibits no edema or tenderness.  Neurological: He is alert and oriented to person, place, and time.  Skin: Skin is warm and dry.  Psychiatric: He has a normal mood and affect. His behavior is normal. Judgment and thought content normal.  Nursing note and vitals reviewed.    ED Treatments / Results  Labs (all labs ordered are listed, but only abnormal results are displayed) Labs Reviewed  CBC - Abnormal; Notable for the following:       Result Value   WBC 12.0 (*)    All other components within normal limits  BASIC METABOLIC PANEL - Abnormal; Notable for the following:    Creatinine, Ser 1.41 (*)    GFR calc non Af Amer 58 (*)    All other components within normal limits  URINALYSIS, ROUTINE W REFLEX MICROSCOPIC - Abnormal; Notable for the following:    Hgb urine dipstick LARGE (*)    All other components within normal limits  URINALYSIS, MICROSCOPIC (REFLEX) - Abnormal; Notable for the following:    Bacteria, UA RARE (*)    All other components within normal limits    EKG  EKG Interpretation None       Radiology No results found.  Procedures Procedures (including critical care time)  Medications Ordered in ED Medications  HYDROmorphone (DILAUDID) injection 1 mg (not administered)  ondansetron (ZOFRAN) injection 4 mg (not administered)     Initial Impression / Assessment and Plan / ED Course  I have reviewed the triage vital signs and the nursing notes.  Pertinent labs & imaging results that were available during my care of the patient were reviewed by me and considered in my medical decision making (see chart for details).  Clinical Course     Patient with left flank pain that radiates to the left testicle. No history of kidney stones, but exam and history is consistent with possible kidney stone. Will check CT renal stone study. Testicular exam is unremarkable. Doubt  torsion. We'll treat pain, will reassess.  CT scan is consistent with stone. It is 2.6 mm. I'm optimistic this will pass on its own. I've given the patient close follow-up instructions. Patient understands and agrees the plan. His urinalysis is unremarkable for evidence of infection. He is well-appearing. He is stable and ready for discharge.  Final Clinical Impressions(s) / ED Diagnoses   Final diagnoses:  Kidney stone    New Prescriptions New Prescriptions   HYDROCODONE-ACETAMINOPHEN (NORCO/VICODIN) 5-325 MG TABLET    Take 1-2 tablets by mouth every 6 (six) hours as needed.   ONDANSETRON (ZOFRAN) 4 MG TABLET    Take 1 tablet (4 mg total) by mouth every 8 (eight)  hours as needed for nausea or vomiting.     Roxy Horseman, PA-C 12/08/16 1123    Jacalyn Lefevre, MD 12/08/16 1416

## 2016-12-08 NOTE — Discharge Instructions (Signed)
Return to the emergency department if your pain or symptoms worsen. Otherwise, please strain your urine, and if you catch the kidney stone, take this to your primary care doctor or to a urologist for analysis. Or unable to pass the stone in the next 3 days, please contact the (urologist) number provided.

## 2016-12-08 NOTE — ED Triage Notes (Signed)
Left flank pain, sitting makes worse, left testicle pain, no problem voiding, states it feels like spasms.

## 2017-01-08 IMAGING — CT CT RENAL STONE PROTOCOL
2 of 4 series · 15 of 46 positions shown, 17 images · non-contrast
Comparison: None.

CLINICAL DATA: Left flank pain, left testicle pain

EXAM:
CT ABDOMEN AND PELVIS WITHOUT CONTRAST
TECHNIQUE: Multidetector CT imaging of the abdomen and pelvis was performed
following the standard protocol without IV contrast.

[Series 2: axial st · axial · 0.97mm/px · z∈[-407,+33]mm · 12 of 98 slices shown, 14 images]
[im 5/98  soft-tissue]
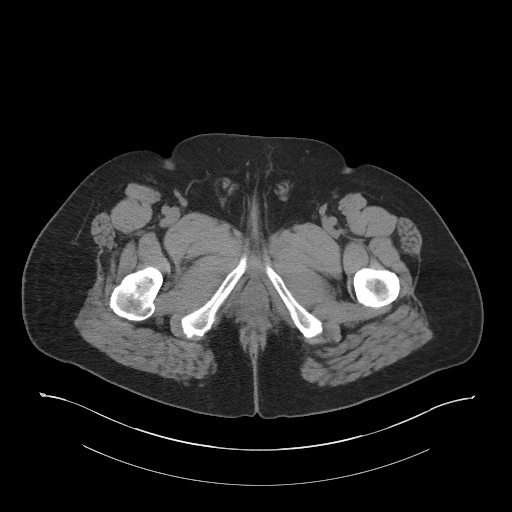
[im 5/98  bone]
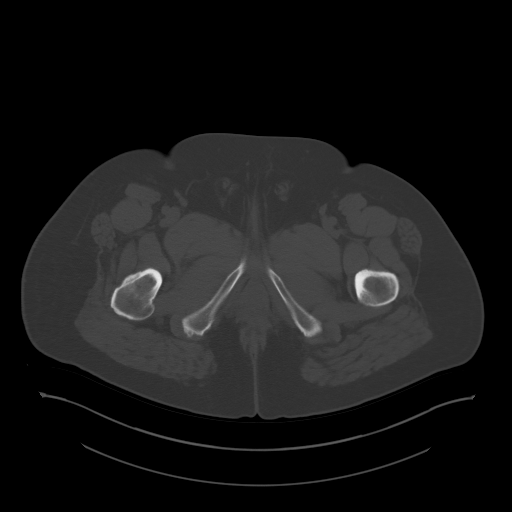
[im 13/98  soft-tissue]
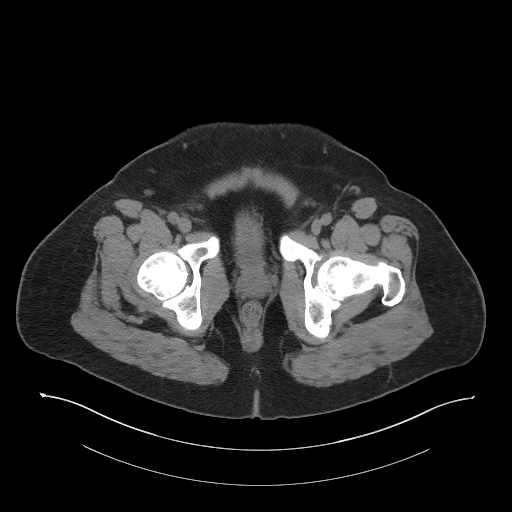
[im 21/98  soft-tissue]
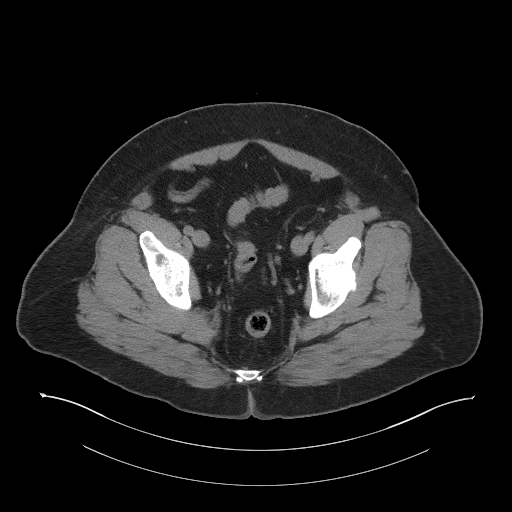
[im 29/98  soft-tissue]
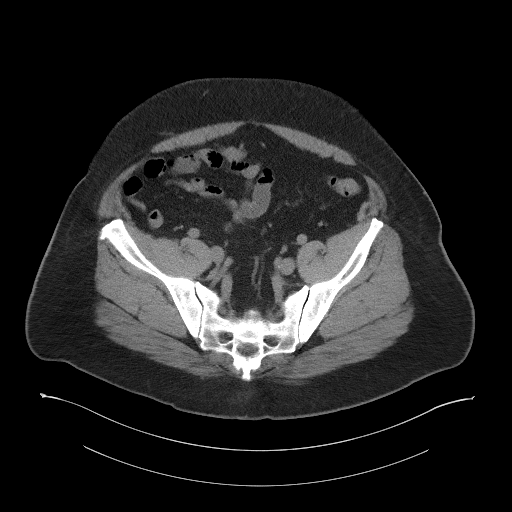
[im 37/98  soft-tissue]
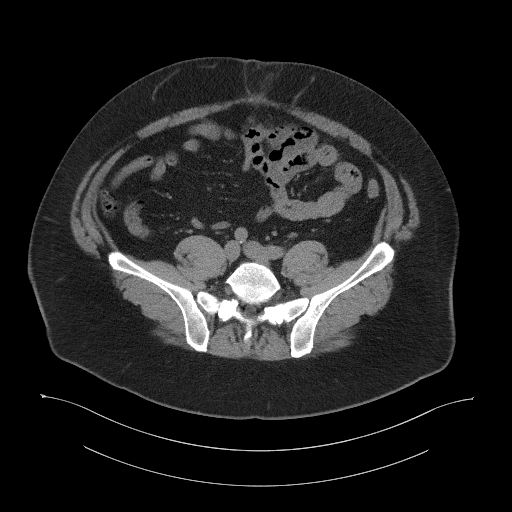
[im 45/98  soft-tissue]
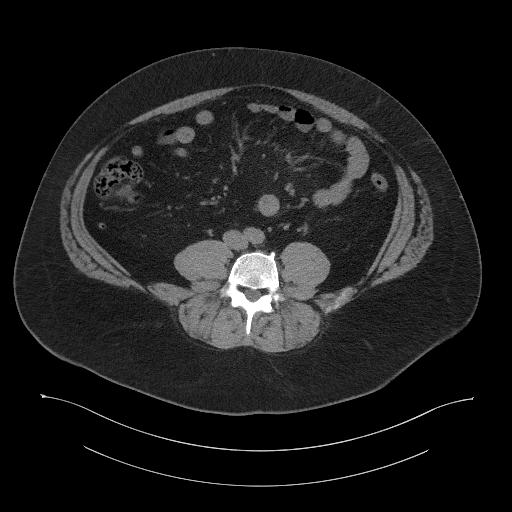
[im 53/98  soft-tissue]
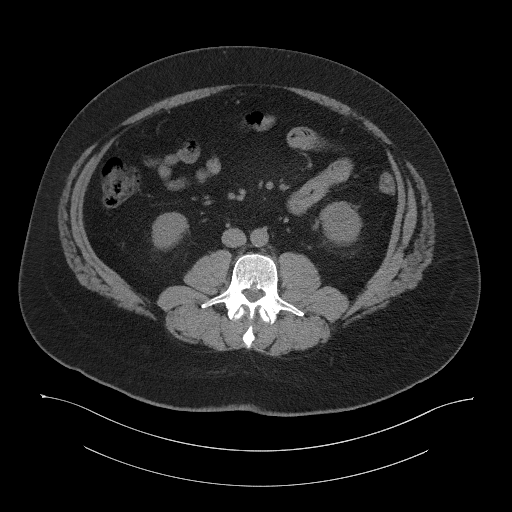
[im 61/98  soft-tissue]
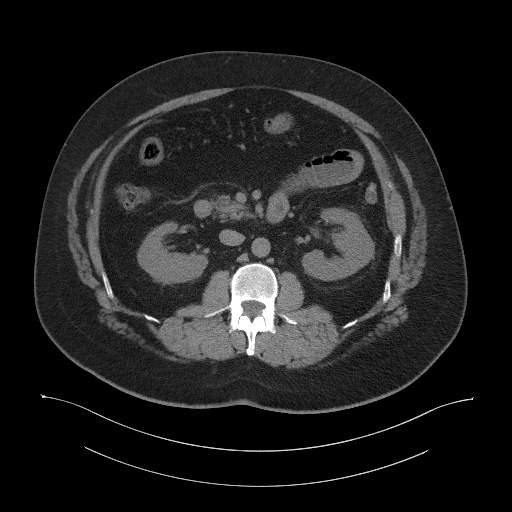
[im 69/98  soft-tissue]
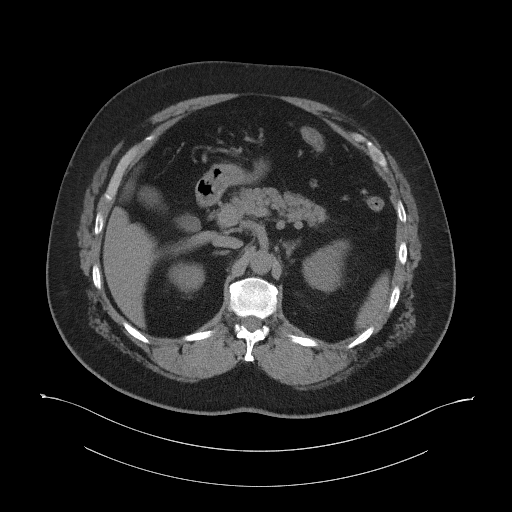
[im 69/98  bone]
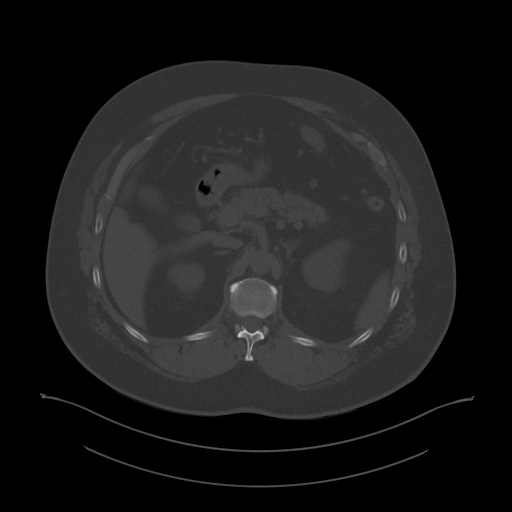
[im 77/98  soft-tissue]
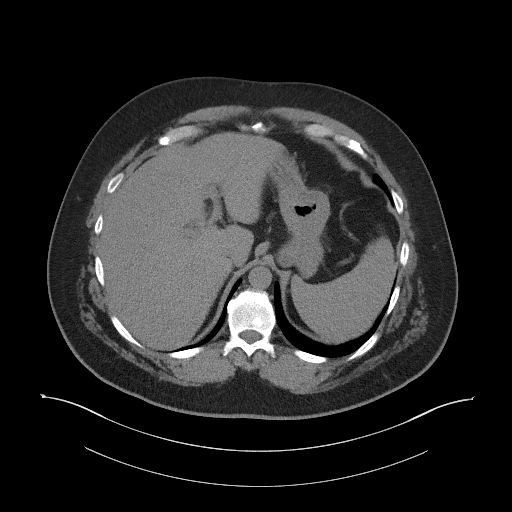
[im 85/98  soft-tissue]
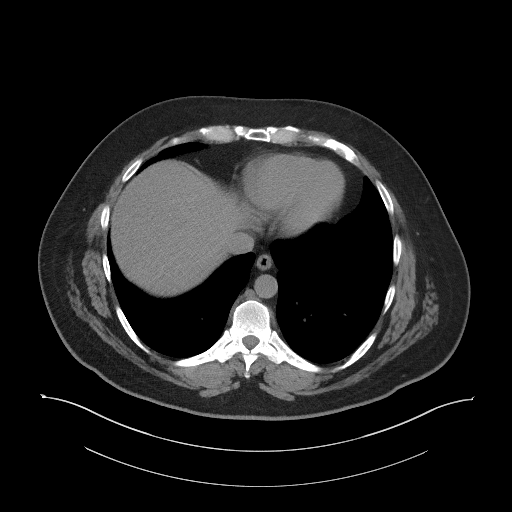
[im 93/98  soft-tissue]
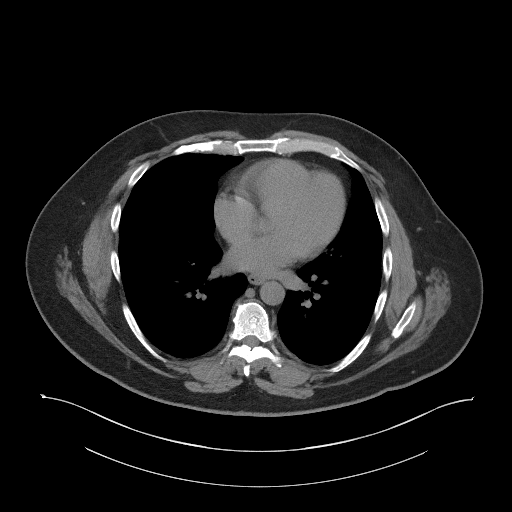

[Series 4: coronal st · coronal · 0.82mm/px · 3 of 110 slices shown]
[im 37/110  soft-tissue]
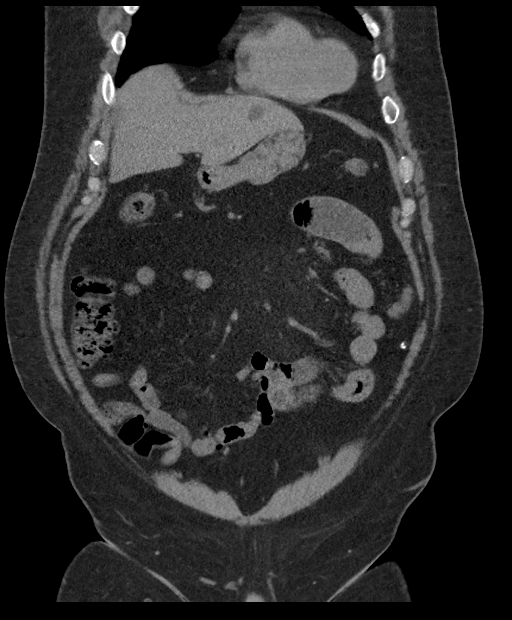
[im 49/110  soft-tissue]
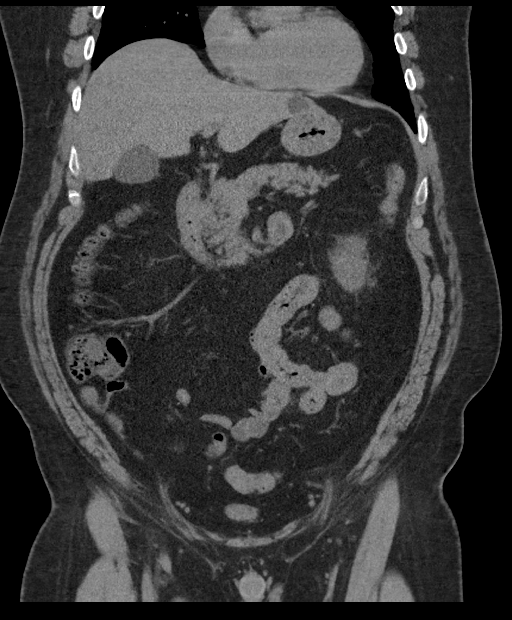
[im 61/110  soft-tissue]
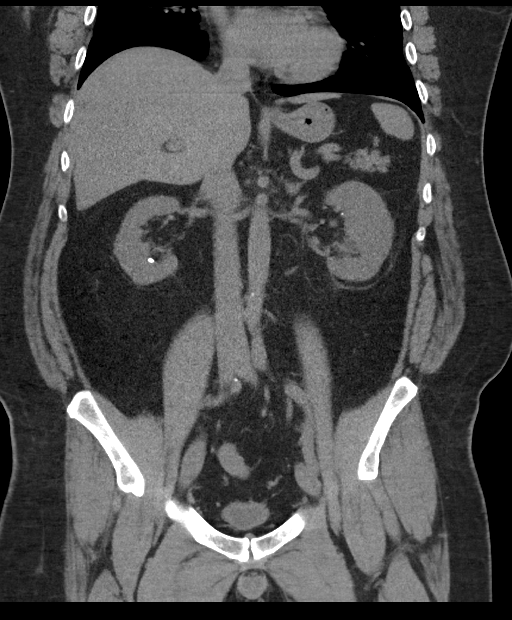

[15 of 46 positions shown; findings below may reference images not displayed]

FINDINGS: Lower chest: Lung bases are normal.

Hepatobiliary: Unenhanced liver shows no biliary ductal dilatation.
Scattered hepatic cysts are noted the largest in left hepatic lobe
lateral segment measures 1.8 cm. No calcified gallstones are noted
within gallbladder.

Pancreas: Unenhanced pancreas with normal appearance

Spleen: Unenhanced spleen with normal appearance.

Adrenals/Urinary Tract: No adrenal gland mass. There is mild left
hydronephrosis and minimal left hydro ureter. Mild left perinephric
and periureteral stranding. Nonobstructive calcified calculus in
lower pole of the right kidney measures 4.8 mm. There is a punctate
nonobstructive calcified calculus in midpole of the left kidney
measures 2 mm. There is poorly calcified calculus probable partially
obstructive in left UVJ/urinary bladder wall. This is best seen in
coronal image 67 measures 2.6 mm. Limited assessment of the urinary
bladder which is empty collapsed.

Stomach/Bowel: No small bowel obstruction. The terminal ileum is
unremarkable. No pericecal inflammation. Normal retrocecal appendix
noted in axial image 53.

Vascular/Lymphatic: Minimal atherosclerotic calcifications of distal
abdominal aorta. No aortic aneurysm. No adenopathy.

Reproductive: The prostate gland and seminal vesicles are
unremarkable. No pelvic masses noted.

Other: There is a umbilical hernia containing fat measures 3.8 by
3.6 cm without evidence of acute complication. Bilateral inguinal
scrotal canal small hernia containing fat without evidence of acute
complication. No abdominal ascites or free abdominal air.

Musculoskeletal: No destructive bony lesions are noted. Sagittal
images of the spine shows mild degenerative changes thoracolumbar
spine.
IMPRESSION: 1. There is mild left hydronephrosis and minimal left hydroureter.
Mild left perinephric and periureteral stranding is noted.
2. Bilateral nonobstructive nephrolithiasis.
3. There is poorly visualized/poorly calcified calculus in left
UVJ/urinary bladder wall measures about 2.6 mm. This is best seen in
coronal image 67. Limited assessment of the urinary bladder which is
empty collapsed. No right ureteral calculi are noted.
4. Normal appendix.  No pericecal inflammation.
5. There is a umbilical hernia containing fat without evidence of
acute complication.
6. Mild degenerative changes thoracolumbar spine.

## 2021-12-22 ENCOUNTER — Emergency Department
Admission: AD | Admit: 2021-12-22 | Discharge: 2021-12-22 | Disposition: A | Discharge status: Home / Self Care | Payer: 59 | Attending: Emergency Medicine | Admitting: Emergency Medicine

## 2021-12-22 DIAGNOSIS — H9201 Otalgia, right ear: Secondary | ICD-10-CM | POA: Insufficient documentation

## 2021-12-22 DIAGNOSIS — R42 Dizziness and giddiness: Secondary | ICD-10-CM | POA: Insufficient documentation

## 2021-12-22 DIAGNOSIS — H938X1 Other specified disorders of right ear: Secondary | ICD-10-CM

## 2021-12-22 MED ORDER — MECLIZINE HCL 25 MG PO TABS
25.00 mg | ORAL_TABLET | Freq: Three times a day (TID) | ORAL | 0 refills | Status: AC | PRN
Start: 2021-12-22 — End: 2021-12-29

## 2021-12-22 MED ORDER — ONDANSETRON HCL 4 MG/2ML IJ SOLN
4.0000 mg | Freq: Once | INTRAMUSCULAR | Status: AC
Start: 2021-12-22 — End: 2021-12-22
  Administered 2021-12-22: 4 mg via INTRAVENOUS
  Filled 2021-12-22: qty 2

## 2021-12-22 MED ORDER — MECLIZINE HCL 25 MG PO TABS
25.00 mg | ORAL_TABLET | Freq: Once | ORAL | Status: AC
Start: 2021-12-22 — End: 2021-12-22
  Administered 2021-12-22: 25 mg via ORAL
  Filled 2021-12-22: qty 1

## 2021-12-22 MED ORDER — SODIUM CHLORIDE 0.9 % IV BOLUS
1000.0000 mL | Freq: Once | INTRAVENOUS | Status: AC
Start: 2021-12-22 — End: 2021-12-22
  Administered 2021-12-22: 1000 mL via INTRAVENOUS

## 2021-12-22 NOTE — ED Triage Note (Signed)
Dizziness, N/V - pt AAOx3, NAD. Arrives with [facility] from home. Pt reports initial episode of dizziness and nausea 3d ago, resolved after 30 min. Awoke at 0415 this morning with same symptoms as well as vomiting and feeling diaphoretic. Feels mostly improved now except for pressure in R ear. Denies hx vertigo. Denies HA.

## 2021-12-22 NOTE — ED Provider Notes (Signed)
This Emergency Department patient encounter note was created using voice-recognition software. Please excuse any typographical errors that have not yet been reviewed and corrected.    [facility] Emergency Medicine Attending Note      Christopher Fleming               MRN: [id number]  PCP: No primary care provider on file.     MDM & ED COURSE:    51 year old male with acute dizziness and episode of vomiting.    History provided by: Patient  History notable for family history of peripheral vertigo.  No recent medication changes.  Recent meniscus repair surgery.  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  But has not worked due to his knee injury.  No medications at baseline.      Here symptoms resolving.  Nonfocal on exam.  No ataxia or nystagmus.  Afebrile, well-appearing in no acute distress.    Symptoms seem more likely peripheral.  He has had some orthostatic symptoms in the past few days which he had attributed to under hydration in the setting of recuperating from his knee surgery.  Here he has no orthostatic symptoms.  He reports some positional component in this right ear fullness suggesting perhaps a vestibular irritation, labyrinthitis, or something like a Mnire's disease.  Does not have actual hearing loss.    Lower suspicion for acute dysrhythmia or cardiac etiology but screen with EKG.  Normal blood sugar by EMS in the field.  Only mild residual nausea but will treat with IV fluid and Zofran then meclizine prophylactically and provide prescription for same.    UPDATE:    12/22/21  0541 12/22/21  0553   BP: 115/78    Pulse: 72    Resp: 16    Temp: 97.6 F    SpO2: 94%    Weight:  117.9 kg (260 lb)     ED Course as of 12/22/21 0705   Wed Dec 22, 2021   0642 Screening ECG normal sinus rhythm with ventricular rate 67.  QTc 435.  Nonischemic.  No delta waves.       Discussed results and patient/family educated on potential diagnoses, he verbalizes understanding and agrees with plan of care. He was told to follow up with his  neurology referral if symptoms persist. I reviewed with him reasons to seek care and/or return to the Emergency Department, all questions were answered.     Independently interpreted by ED provider: ECG    IMPRESSION(S):  Vertigo  Ear pressure, right    CONDITION: Stable    DISPOSITION:   Discharge    ED PRESCRIPTIONS:      Medication List      START taking these medications    meclizine 25 MG Tabs  Commonly known as: ANTIVERT  Take 1 tablet by mouth every 8 (eight) hours as needed  for up to 7 days           Where to Get Your Medications      You can get these medications from any pharmacy    Bring a paper prescription for each of these medications   meclizine 25 MG Tabs        Triage Vital Signs:  ED Triage Vitals [12/22/21 0541]   ED Triage Vitals Brief Group      Temp 97.6 F      Pulse 72      Resp 16      BP 115/78  SpO2 94 %      Pain Score        PHYSICAL EXAM:   General:  No acute distress, appears comfortable  Skin: Warm and dry  HEENT: Normocephalic, atraumatic, extraocular movements intact, no nystagmus, TMs and canals normal in appearance bilaterally  Eyes:  Anicteric, extraocular  movements intact  Neck: Supple, trachea midline  CV:  Well-perfused  Chest:  Unlabored  Abdomen:  Nondistended  Extremities: No deformities, no edema  Neuro: Alert,oriented,  fluent speech, moving all extremities, following commands, no ataxia  Psych: Normal mood, cooperative, organized thought process              Medications Given in ED:  Medications   ondansetron (ZOFRAN) injection 4 mg (4 mg Intravenous Given 12/22/21 0637)   sodium chloride 0.9 % IV bolus 1,000 mL (1,000 mLs Intravenous New Bag 12/22/21 0637)   meclizine (ANTIVERT) tablet 25 mg (25 mg Oral Given 12/22/21 [id number])     Radiology:  No orders to display    Lab Results:  Labs Reviewed - No data to display     HISTORY OF PRESENT ILLNESS:    Christopher Fleming is a 51 year old male patient who presented to [place] via [employer] with chief  complaint of Nausea and Dizziness  .  51 year old male with acute dizziness nausea and vomiting that began while he rolled over in bed this evening.  Few days ago on Sunday he developed acute dizziness and nausea subsequently had 1 loose bowel movement.  In the interim symptoms completely resolved.  Woke up this morning feeling diaphoretic nauseous like there was right-sided ear pressure and that when he turned he triggered his symptoms.  As a family member with longstanding issues with peripheral vertigo.  He himself has never had any notable symptoms.  Recently had meniscus surgery and was concerned about walking the stairs with his episodes of dizziness.  Reports his nausea has nearly completely resolved.  No numbness weakness.  No recent fever chills or URI symptoms.  Fingerstick by EMS within normal limits.    Of note still having some right-sided ear "fullness" but not pain.  No ear discharge.  No ear trauma    Fleming:  Pertinent positives and negatives per HPI.          Triage Documentation     Christopher Rotunda, RN 12/22/2021 05:57             Dizziness, N/V - pt AAOx3, NAD. Arrives with [facility] from home. Pt reports initial episode of dizziness and nausea 3d ago, resolved after 30 min. Awoke at 0415 this morning with same symptoms as well as vomiting and feeling diaphoretic. Feels mostly improved now except for pressure in R ear. Denies hx vertigo. Denies HA.                Past Medical Hx:  No past medical history on file. Meds:  No current facility-administered medications for this encounter.     Current Outpatient Medications   Medication Sig    meclizine (ANTIVERT) 25 MG TABS Take 1 tablet by mouth every 8 (eight) hours as needed  for up to 7 days         Past Surgical Hx:  No past surgical history on file. Allergies:  Review of Patient's Allergies indicates:  No Known Allergies   Social Hx:  Social History    Tobacco Use      Smoking status: Not on file  Smokeless tobacco: Not on file    Alcohol use:  Not on file   Immunizations:    There is no immunization history on file for this patient.     The patient was seen primarily by me.  [facility] ED nursing records reviewed. [facility] prior records as available electronically through the Epic record were reviewed.     Signed by Donato Schultz, MD Portal  Attending Physician, Emergency [facility]

## 2021-12-22 NOTE — Narrator Note (Signed)
Patient Disposition  Patient education for diagnosis, medications, activity, diet and follow-up.  Patient left ED 9:05 AM.  Patient rep received written instructions.    Interpreter to provide instructions: NA    Patient belongings with patient: YES    Have all existing LDAs been addressed? YES    Have all IV infusions been stopped? YES    Destination: Pt discharged home with summary and script.Patient provided verbal and written discharge instructions in primary language of care.  Verbalized understanding. Left ED ambulatory.

## 2021-12-22 NOTE — Discharge Instructions (Addendum)
PLEASE REVIEW    Please review the following information being provided related to today's Emergency Department visit.    Your evaluated the emergency department for your vertigo.  Your screening EKG here was normal.  Your blood sugar as evaluated by EMS was within normal limits.    You were given some nausea medicine and IV fluid here.    Based on your evaluation you are safe to be discharged at this time.  Please review the care instructions provided in your preferred language.  There are many causes of peripheral vertigo and sometimes it takes a clear pattern of evolving symptoms to further clarify and targeted treatment however for now we are providing you with a medicine that can provide some appreciable relief if taken early when symptoms were first noted and before vomiting.    Follow-up is important part of your care.  If you have persistent or evolving symptoms you should seek care.  Please try to follow-up with neurology in the outpatient setting however if you feel like your symptoms are acutely worsening particularly if they evolve to involve sudden numbness or weakness of 1 side of your body these are all signs of a potential emergency and should be evaluated right away

## 2021-12-26 LAB — EKG
